# Patient Record
Sex: Female | Born: 1975 | Race: Black or African American | Hispanic: No | Marital: Single | Smoking: Current every day smoker
Health system: Southern US, Community
[De-identification: ages and names within clinical notes are randomized; demographics above are authoritative.]

## PROBLEM LIST (undated history)

## (undated) DIAGNOSIS — K5090 Crohn's disease, unspecified, without complications: Secondary | ICD-10-CM

## (undated) DIAGNOSIS — J45909 Unspecified asthma, uncomplicated: Secondary | ICD-10-CM

## (undated) DIAGNOSIS — G40909 Epilepsy, unspecified, not intractable, without status epilepticus: Secondary | ICD-10-CM

## (undated) DIAGNOSIS — M1990 Unspecified osteoarthritis, unspecified site: Secondary | ICD-10-CM

## (undated) DIAGNOSIS — F4310 Post-traumatic stress disorder, unspecified: Secondary | ICD-10-CM

## (undated) DIAGNOSIS — Z5189 Encounter for other specified aftercare: Secondary | ICD-10-CM

## (undated) DIAGNOSIS — C260 Malignant neoplasm of intestinal tract, part unspecified: Secondary | ICD-10-CM

## (undated) HISTORY — PX: COLON SURGERY: SHX602

## (undated) HISTORY — PX: SMALL INTESTINE SURGERY: SHX150

---

## 2007-11-05 ENCOUNTER — Emergency Department (HOSPITAL_BASED_OUTPATIENT_CLINIC_OR_DEPARTMENT_OTHER): Admission: EM | Admit: 2007-11-05 | Discharge: 2007-11-05 | Payer: Self-pay | Admitting: Emergency Medicine

## 2009-07-05 ENCOUNTER — Emergency Department (HOSPITAL_BASED_OUTPATIENT_CLINIC_OR_DEPARTMENT_OTHER): Admission: EM | Admit: 2009-07-05 | Discharge: 2009-07-05 | Payer: Self-pay | Admitting: Emergency Medicine

## 2009-07-05 ENCOUNTER — Ambulatory Visit: Payer: Self-pay | Admitting: Diagnostic Radiology

## 2010-06-14 LAB — BASIC METABOLIC PANEL
BUN: 9 mg/dL (ref 6–23)
Calcium: 8.7 mg/dL (ref 8.4–10.5)
GFR calc Af Amer: >60 mL/min (ref >60)
Glucose, Bld: 83 mg/dL (ref 70–99)

## 2010-06-14 LAB — DIFFERENTIAL
Eosinophils Relative: 2 % (ref 0–5)
Lymphocytes Relative: 39 % (ref 12–46)
Monocytes Absolute: 0.3 10*3/uL (ref 0.1–1.0)
Neutrophils Relative %: 52 % (ref 43–77)

## 2010-06-14 LAB — URINE MICROSCOPIC-ADD ON

## 2010-06-14 LAB — POCT TOXICOLOGY PANEL

## 2010-06-14 LAB — CBC
HCT: 26.7 % — ABNORMAL LOW (ref 36.0–46.0)
Hemoglobin: 8.4 g/dL — ABNORMAL LOW (ref 12.0–15.0)
MCHC: 31.6 g/dL (ref 30.0–36.0)
MCV: 67.6 fL — ABNORMAL LOW (ref 78.0–100.0)
RDW: 16.1 % — ABNORMAL HIGH (ref 11.5–15.5)
WBC: 4.9 10*3/uL (ref 4.0–10.5)

## 2010-06-14 LAB — URINALYSIS, ROUTINE W REFLEX MICROSCOPIC
Bilirubin Urine: NEGATIVE
Glucose, UA: NEGATIVE mg/dL
Hgb urine dipstick: NEGATIVE
Ketones, ur: NEGATIVE mg/dL
Protein, ur: NEGATIVE mg/dL
Urobilinogen, UA: 0.2 mg/dL (ref 0.0–1.0)

## 2010-06-14 LAB — MAGNESIUM: Magnesium: 1.8 mg/dL (ref 1.5–2.5)

## 2011-09-12 IMAGING — US US OB TRANSVAGINAL
1 series · 13 of 28 positions shown · non-contrast
Comparison: None.

CLINICAL DATA: Positive pregnancy test performed in the ER.
History of cervical cancer with three-quarters of the cervix
removed.  History of miscarriage on 05/31/2009 with no menses since
miscarriage.

OBSTETRIC <14 WK US AND TRANSVAGINAL OB US
TECHNIQUE: Both transabdominal and transvaginal ultrasound
examinations were performed for complete evaluation of the
gestation as well as the maternal uterus, adnexal regions, and
pelvic cul-de-sac.

[Series 1: us ob transvaginal · 0.26mm/px · 13 of 44 slices shown]
[im 2/44]
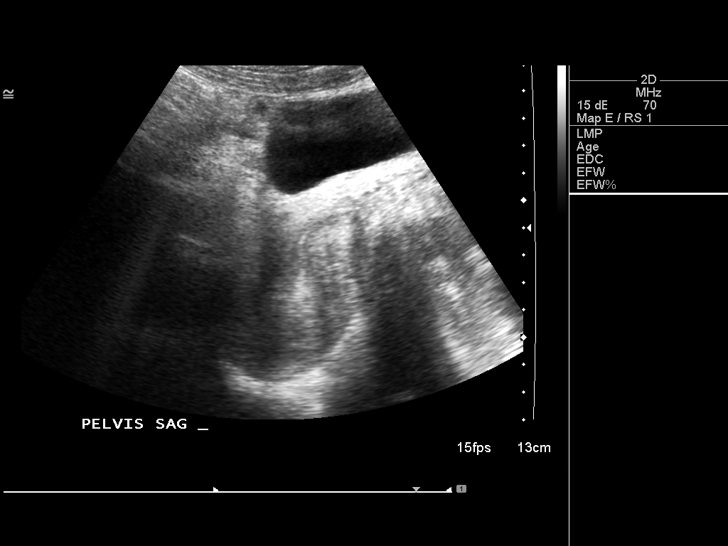
[im 5/44]
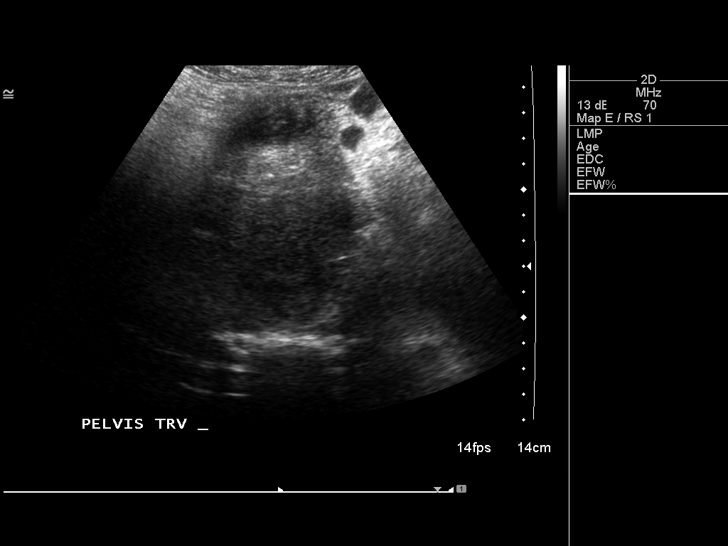
[im 8/44]
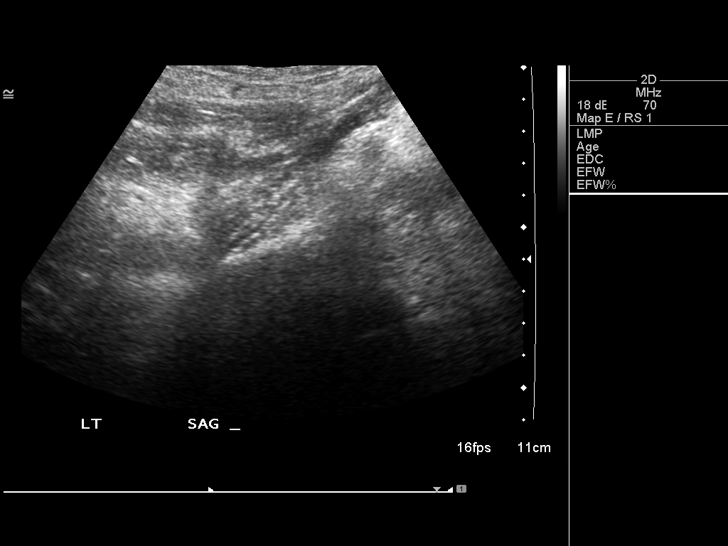
[im 12/44]
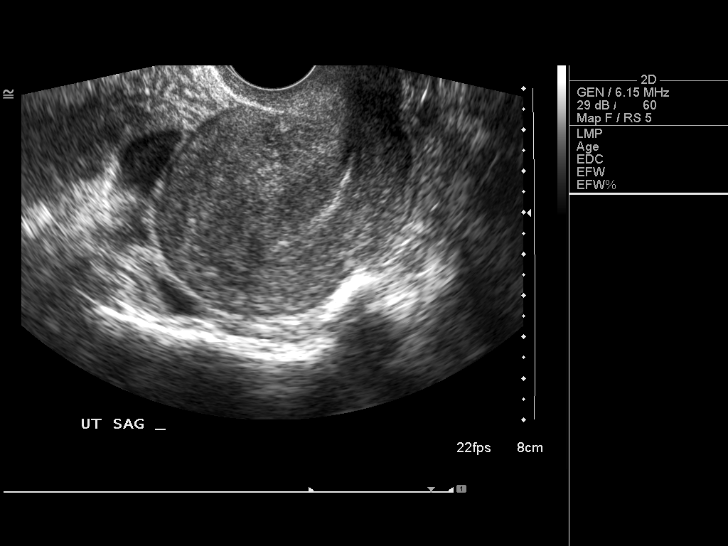
[im 15/44]
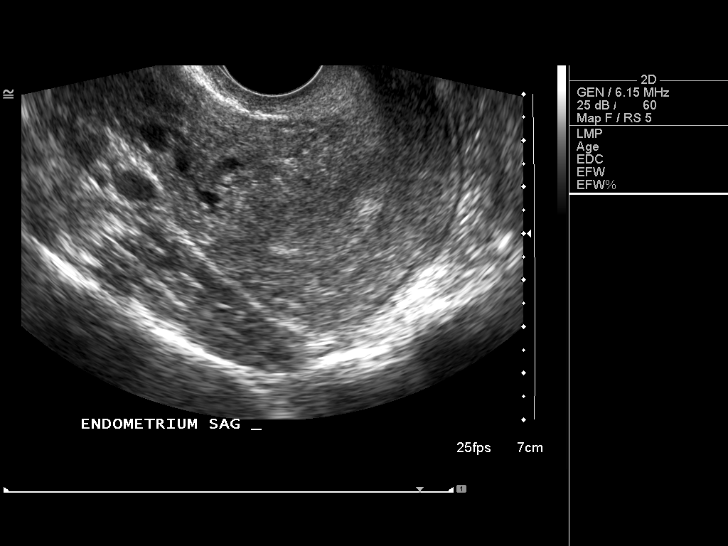
[im 18/44]
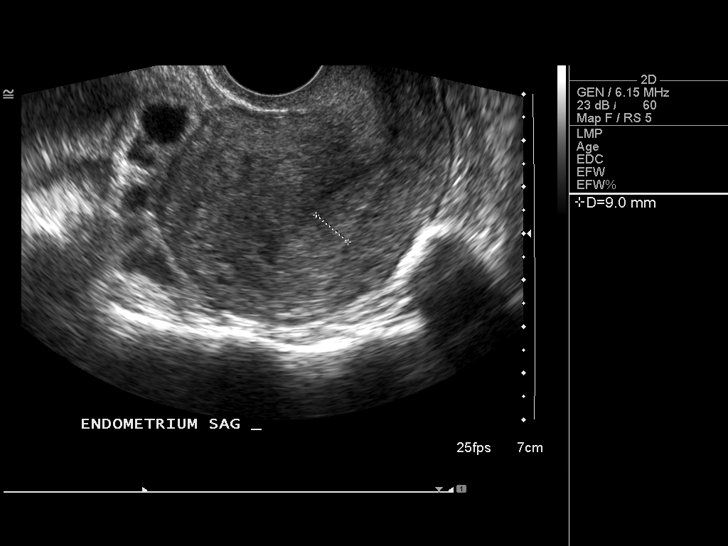
[im 23/44]
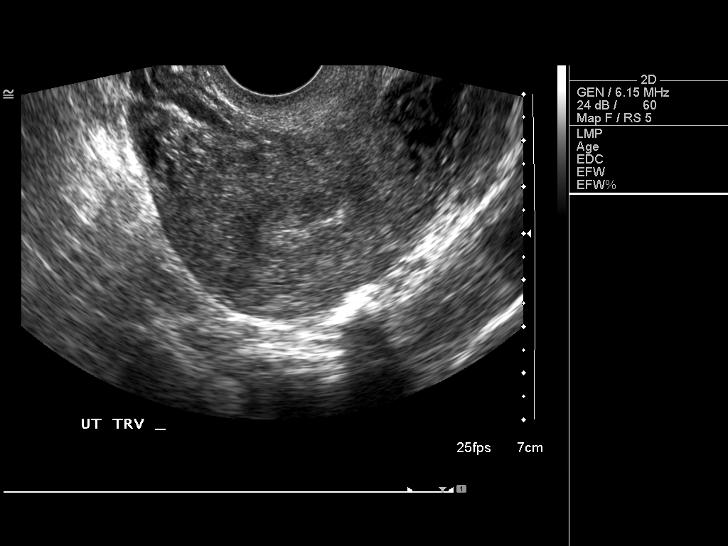
[im 26/44]
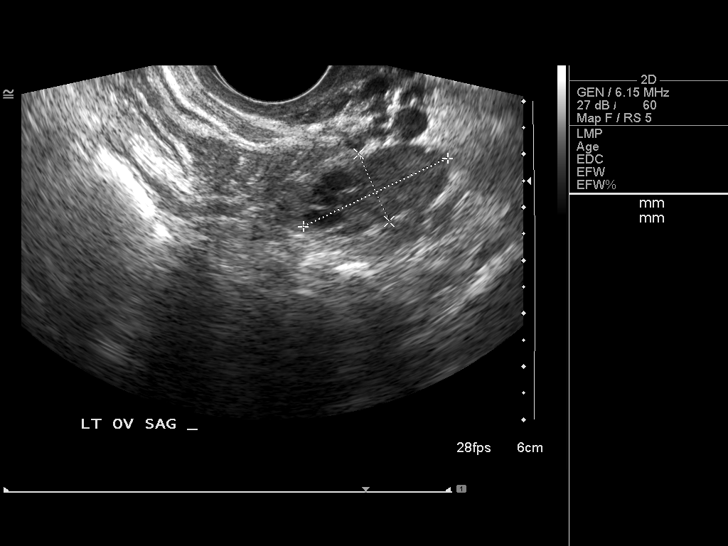
[im 29/44]
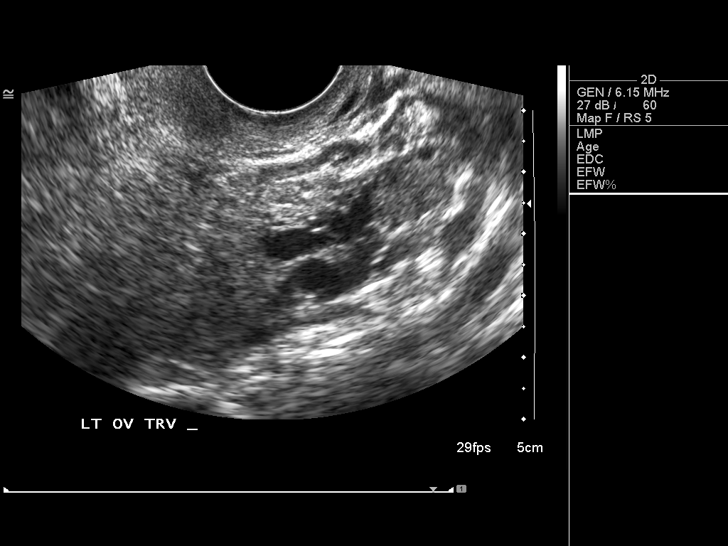
[im 32/44]
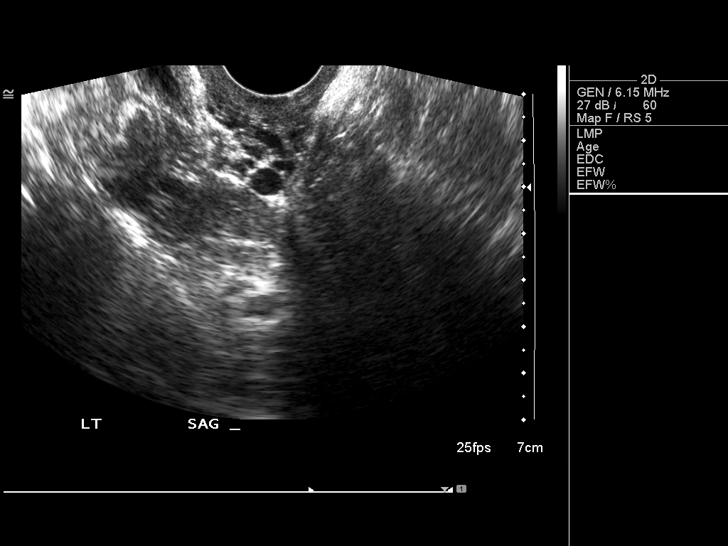
[im 36/44]
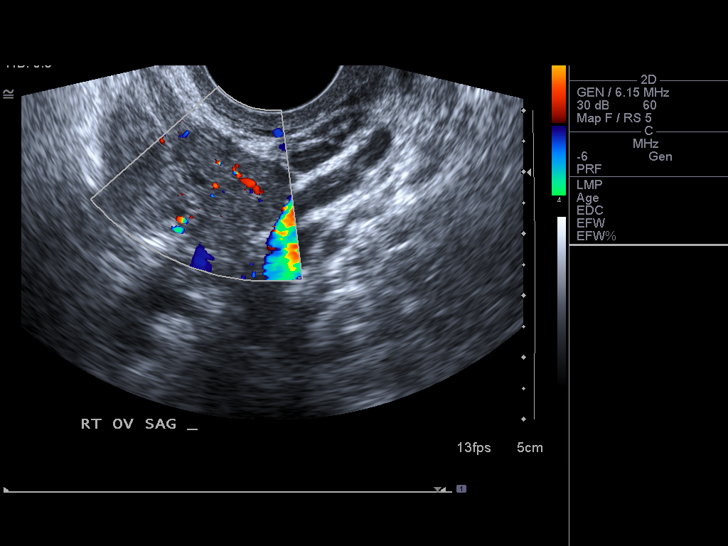
[im 39/44]
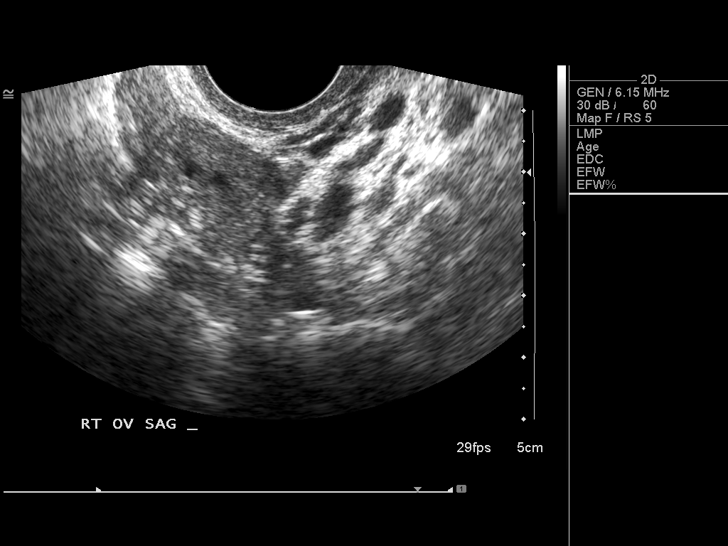
[im 42/44]
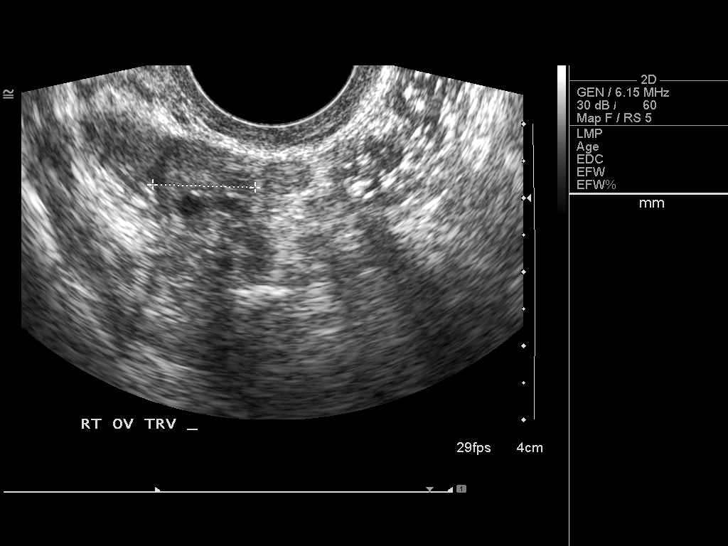

[13 of 28 positions shown; findings below may reference images not displayed]

Intrauterine gestational sac: Not seen
Yolk sac: Not seen
Embryo: Not seen
Cardiac Activity: Not applicable
Heart Rate:  bpm

MSD:   mm      w     d
CRL:               w     d          US EDC:

Maternal uterus/adnexae:
The uterus is retroverted and demonstrates a sagittal length of
cm, an AP width of 5.0 cm and a transverse width of 5.8 cm.  The
myometrium is homogeneous.  The endometrial lining is homogeneously
echogenic with an AP width of 10.8 mm.

Both ovaries have a normal appearance with the left ovary measuring
1.4 x 3.0 x 1.4 cm and the right ovary measuring 2.5 x 1.1 by
cm.  No separate adnexal masses or pelvic fluid are seen.
IMPRESSION: No evidence for an intrauterine gestation is seen.  The endometrial
lining is normal in width and appears homogeneous.  The ovaries are
normal with no separate adnexal masses or signs of intrapelvic
fluid seen.  Today's findings could correlate with an early
intrauterine pregnancy that is sonographically not distinguishable
because it is too early, a sonographically silent ectopic gestation
or an abnormal nonprogressing gestation.  Correlation with serial
quantitative beta HCG would be useful to help differentiate between
these possibilities with follow-up ultrasound as indicated.

We would expect to see evidence for an intrauterine gestational sac
with quantitative beta HCG of greater than 8644.

## 2014-09-05 ENCOUNTER — Encounter (HOSPITAL_BASED_OUTPATIENT_CLINIC_OR_DEPARTMENT_OTHER): Payer: Self-pay | Admitting: Emergency Medicine

## 2014-09-05 ENCOUNTER — Emergency Department (HOSPITAL_BASED_OUTPATIENT_CLINIC_OR_DEPARTMENT_OTHER): Payer: Medicare Other

## 2014-09-05 ENCOUNTER — Emergency Department (HOSPITAL_BASED_OUTPATIENT_CLINIC_OR_DEPARTMENT_OTHER)
Admission: EM | Admit: 2014-09-05 | Discharge: 2014-09-05 | Disposition: A | Discharge status: Home / Self Care | Payer: Medicare Other | Attending: Emergency Medicine | Admitting: Emergency Medicine

## 2014-09-05 DIAGNOSIS — Z720 Tobacco use: Secondary | ICD-10-CM | POA: Diagnosis not present

## 2014-09-05 DIAGNOSIS — Y998 Other external cause status: Secondary | ICD-10-CM | POA: Insufficient documentation

## 2014-09-05 DIAGNOSIS — Z8739 Personal history of other diseases of the musculoskeletal system and connective tissue: Secondary | ICD-10-CM | POA: Diagnosis not present

## 2014-09-05 DIAGNOSIS — Z85038 Personal history of other malignant neoplasm of large intestine: Secondary | ICD-10-CM | POA: Insufficient documentation

## 2014-09-05 DIAGNOSIS — Y9389 Activity, other specified: Secondary | ICD-10-CM | POA: Insufficient documentation

## 2014-09-05 DIAGNOSIS — Y9289 Other specified places as the place of occurrence of the external cause: Secondary | ICD-10-CM | POA: Insufficient documentation

## 2014-09-05 DIAGNOSIS — Z8719 Personal history of other diseases of the digestive system: Secondary | ICD-10-CM | POA: Insufficient documentation

## 2014-09-05 DIAGNOSIS — S59901A Unspecified injury of right elbow, initial encounter: Secondary | ICD-10-CM | POA: Diagnosis present

## 2014-09-05 DIAGNOSIS — Z3202 Encounter for pregnancy test, result negative: Secondary | ICD-10-CM | POA: Diagnosis not present

## 2014-09-05 DIAGNOSIS — S5001XA Contusion of right elbow, initial encounter: Secondary | ICD-10-CM | POA: Diagnosis not present

## 2014-09-05 DIAGNOSIS — W208XXA Other cause of strike by thrown, projected or falling object, initial encounter: Secondary | ICD-10-CM | POA: Diagnosis not present

## 2014-09-05 DIAGNOSIS — Z8659 Personal history of other mental and behavioral disorders: Secondary | ICD-10-CM | POA: Insufficient documentation

## 2014-09-05 DIAGNOSIS — Z8669 Personal history of other diseases of the nervous system and sense organs: Secondary | ICD-10-CM | POA: Insufficient documentation

## 2014-09-05 HISTORY — DX: Epilepsy, unspecified, not intractable, without status epilepticus: G40.909

## 2014-09-05 HISTORY — DX: Post-traumatic stress disorder, unspecified: F43.10

## 2014-09-05 HISTORY — DX: Crohn's disease, unspecified, without complications: K50.90

## 2014-09-05 HISTORY — DX: Malignant neoplasm of intestinal tract, part unspecified: C26.0

## 2014-09-05 HISTORY — DX: Unspecified osteoarthritis, unspecified site: M19.90

## 2014-09-05 LAB — URINALYSIS, ROUTINE W REFLEX MICROSCOPIC
BILIRUBIN URINE: NEGATIVE
GLUCOSE, UA: NEGATIVE mg/dL
Ketones, ur: NEGATIVE mg/dL
LEUKOCYTES UA: NEGATIVE
NITRITE: NEGATIVE
PROTEIN: NEGATIVE mg/dL
Specific Gravity, Urine: 1.011 (ref 1.005–1.030)
UROBILINOGEN UA: 1 mg/dL (ref 0.0–1.0)
pH: 6 (ref 5.0–8.0)

## 2014-09-05 LAB — URINE MICROSCOPIC-ADD ON

## 2014-09-05 LAB — PREGNANCY, URINE: Preg Test, Ur: NEGATIVE

## 2014-09-05 MED ORDER — HYDROCODONE-ACETAMINOPHEN 5-325 MG PO TABS: 2.0000 | ORAL_TABLET | ORAL | Status: AC | PRN

## 2014-09-05 NOTE — ED Provider Notes (Signed)
CSN: [id number]     Arrival date & time 09/05/14  1800 History  This chart was scribed for Leonard Schwartz, MD by Hansel Feinstein, ED Scribe. This patient was seen in room MH11/MH11 and the patient's care was started at 6:41 PM.     Chief Complaint  Patient presents with  . Arm Pain   The history is provided by the patient. No language interpreter was used.    HPI Comments: Patricia Turner is a 39 y.o. female who presents to the Emergency Department complaining of moderate, sudden onset, shooting right elbow pain that radiates to the hand onset earlier this evening when she was moving a bed and it fell on her. She states that pain is not relieved by anything and is worsened with movement. Per pt, she has had a similar injury in her other elbow. She denies other injuries or falls. She also denies numbness.    Past Medical History  Diagnosis Date  . Crohn's disease   . Intestinal cancer   . Epilepsy   . Arthritis   . PTSD (post-traumatic stress disorder)    Past Surgical History  Procedure Laterality Date  . Small intestine surgery     History reviewed. No pertinent family history. History  Substance Use Topics  . Smoking status: Current Some Day Smoker  . Smokeless tobacco: Never Used  . Alcohol Use: No   OB History    No data available     Review of Systems A complete 10 system review of systems was obtained and all systems are negative except as noted in the HPI and PMH.    Allergies  Ivp dye  Home Medications   Prior to Admission medications   Medication Sig Start Date End Date Taking? Authorizing Provider  HYDROcodone-acetaminophen (NORCO/VICODIN) 5-325 MG per tablet Take 2 tablets by mouth every 4 (four) hours as needed. 09/05/14   Leonard Schwartz, MD   BP 121/68 mmHg  Pulse 82  Temp(Src) 98.4 F (36.9 C) (Oral)  Resp 18  Ht 5\' 10"  (1.778 m)  Wt 137 lb (62.143 kg)  BMI 19.66 kg/m2  SpO2 96%  LMP 08/05/2014 Physical Exam  Constitutional: She is oriented to  person, place, and time. She appears well-developed and well-nourished. No distress.  HENT:  Head: Normocephalic and atraumatic.  Eyes: Pupils are equal, round, and reactive to light.  Neck: Normal range of motion.  Cardiovascular: Normal rate and intact distal pulses.   Pulmonary/Chest: No respiratory distress.  Abdominal: Normal appearance. She exhibits no distension.  Musculoskeletal: Normal range of motion. She exhibits tenderness.       Right elbow: She exhibits no swelling, no effusion and no deformity. Tenderness found.  Neurological: She is alert and oriented to person, place, and time. No cranial nerve deficit.  Skin: Skin is warm and dry. No rash noted.  Psychiatric: She has a normal mood and affect. Her behavior is normal.  Nursing note and vitals reviewed.   ED Course  Procedures (including critical care time) DIAGNOSTIC STUDIES: Oxygen Saturation is 96% on RA, adequate by my interpretation.    COORDINATION OF CARE: 6:43 PM Discussed treatment plan with pt at bedside and pt agreed to plan.   Labs Review Labs Reviewed  URINALYSIS, ROUTINE W REFLEX MICROSCOPIC (NOT AT [facility]) - Abnormal; Notable for the following:    APPearance CLOUDY (*)    Hgb urine dipstick TRACE (*)    All other components within normal limits  URINE MICROSCOPIC-ADD ON - Abnormal; Notable for the  following:    Squamous Epithelial / LPF MANY (*)    Bacteria, UA MANY (*)    All other components within normal limits  PREGNANCY, URINE    Imaging Review Dg Elbow Complete Right  09/05/2014   CLINICAL DATA:  Initial evaluation for acute trauma, fall.  EXAM: RIGHT ELBOW - COMPLETE 3+ VIEW  COMPARISON:  None.  FINDINGS: There is no evidence of fracture, dislocation, or joint effusion. There is no evidence of arthropathy or other focal bone abnormality. Soft tissues are unremarkable.  IMPRESSION: Negative.   Electronically Signed   By: Jeannine Boga M.D.   On: 09/05/2014 19:24      MDM   Final  diagnoses:  Elbow contusion, right, initial encounter    I personally performed the services described in this documentation, which was scribed in my presence. The recorded information has been reviewed and considered.     Leonard Schwartz, MD 09/05/14 [id number]

## 2014-09-05 NOTE — Discharge Instructions (Signed)
Contusion °A contusion is a deep bruise. Contusions happen when an injury causes bleeding under the skin. Signs of bruising include pain, puffiness (swelling), and discolored skin. The contusion may turn blue, purple, or yellow. °HOME CARE  °· Put ice on the injured area. °¨ Put ice in a plastic bag. °¨ Place a towel between your skin and the bag. °¨ Leave the ice on for 15-20 minutes, 03-04 times a day. °· Only take medicine as told by your doctor. °· Rest the injured area. °· If possible, raise (elevate) the injured area to lessen puffiness. °GET HELP RIGHT AWAY IF:  °· You have more bruising or puffiness. °· You have pain that is getting worse. °· Your puffiness or pain is not helped by medicine. °MAKE SURE YOU:  °· Understand these instructions. °· Will watch your condition. °· Will get help right away if you are not doing well or get worse. °Document Released: 08/29/2007 Document Revised: 06/04/2011 Document Reviewed: 01/15/2011 °[employer]® Patient Information ©2015 [employer]. This information is not intended to replace advice given to you by your health care provider. Make sure you discuss any questions you have with your health care provider. ° °

## 2014-09-05 NOTE — ED Notes (Signed)
Pt in c/o arm pain after moving a bed and the bed falling on her elbow. States electric type shooting pain down to hand.

## 2014-12-12 ENCOUNTER — Emergency Department (HOSPITAL_BASED_OUTPATIENT_CLINIC_OR_DEPARTMENT_OTHER)
Admission: EM | Admit: 2014-12-12 | Discharge: 2014-12-12 | Disposition: A | Discharge status: Home / Self Care | Payer: Medicare Other | Attending: Emergency Medicine | Admitting: Emergency Medicine

## 2014-12-12 DIAGNOSIS — Z8659 Personal history of other mental and behavioral disorders: Secondary | ICD-10-CM | POA: Insufficient documentation

## 2014-12-12 DIAGNOSIS — Z8739 Personal history of other diseases of the musculoskeletal system and connective tissue: Secondary | ICD-10-CM | POA: Insufficient documentation

## 2014-12-12 DIAGNOSIS — K088 Other specified disorders of teeth and supporting structures: Secondary | ICD-10-CM | POA: Insufficient documentation

## 2014-12-12 DIAGNOSIS — Z8669 Personal history of other diseases of the nervous system and sense organs: Secondary | ICD-10-CM | POA: Diagnosis not present

## 2014-12-12 DIAGNOSIS — K0889 Other specified disorders of teeth and supporting structures: Secondary | ICD-10-CM

## 2014-12-12 DIAGNOSIS — Z85038 Personal history of other malignant neoplasm of large intestine: Secondary | ICD-10-CM | POA: Diagnosis not present

## 2014-12-12 DIAGNOSIS — Z720 Tobacco use: Secondary | ICD-10-CM | POA: Diagnosis not present

## 2014-12-12 MED ORDER — IBUPROFEN 800 MG PO TABS
800.0000 mg | ORAL_TABLET | Freq: Once | ORAL | Status: AC
  Administered 2014-12-12: 800 mg via ORAL
  Filled 2014-12-12: qty 1

## 2014-12-12 MED ORDER — AZITHROMYCIN 250 MG PO TABS: 250.0000 mg | ORAL_TABLET | Freq: Every day | ORAL | Status: DC

## 2014-12-12 MED ORDER — IBUPROFEN 800 MG PO TABS: 800.0000 mg | ORAL_TABLET | Freq: Three times a day (TID) | ORAL | Status: AC

## 2014-12-12 MED ORDER — AZITHROMYCIN 250 MG PO TABS
500.0000 mg | ORAL_TABLET | Freq: Once | ORAL | Status: AC
  Administered 2014-12-12: 500 mg via ORAL
  Filled 2014-12-12: qty 2

## 2014-12-12 MED ORDER — PENICILLIN V POTASSIUM 500 MG PO TABS: 500.0000 mg | ORAL_TABLET | Freq: Four times a day (QID) | ORAL | Status: DC

## 2014-12-12 MED ORDER — BUPIVACAINE-EPINEPHRINE (PF) 0.5% -1:200000 IJ SOLN
1.8000 mL | Freq: Once | INTRAMUSCULAR | Status: DC
Start: 2014-12-12 — End: 2014-12-13
  Filled 2014-12-12: qty 1.8

## 2014-12-12 NOTE — ED Provider Notes (Signed)
CSN: [id number]     Arrival date & time 12/12/14  2001 History  This chart was scribed for Evelina Bucy, MD by Julien Nordmann, ED Scribe. This patient was seen in room MH10/MH10 and the patient's care was started at 8:18 PM.     Chief Complaint  Patient presents with  . Dental Pain      Patient is a 39 y.o. female presenting with tooth pain. The history is provided by the patient. No language interpreter was used.  Dental Pain Location:  Upper Upper teeth location:  11/LU cuspid Quality:  Constant Severity:  Moderate Onset quality:  Gradual Duration:  4 days Timing:  Constant Chronicity:  New Context: poor dentition   Relieved by:  Nothing Worsened by:  Nothing tried Ineffective treatments:  None tried Associated symptoms: facial pain and fever    HPI Comments: Patricia Turner is a 39 y.o. female who presents to the Emergency Department complaining of constant, gradual worsening left upper molar pain onset a few days ago. Pt reports having an associated fever yesterday and today of 101. She states taking tylenol to alleviate the pain with no relief. She states it made her break out in hives. Pt denies trouble swallowing and difficulty breathing.  Past Medical History  Diagnosis Date  . Crohn's disease   . Intestinal cancer   . Epilepsy   . Arthritis   . PTSD (post-traumatic stress disorder)    Past Surgical History  Procedure Laterality Date  . Small intestine surgery     No family history on file. Social History  Substance Use Topics  . Smoking status: Current Some Day Smoker  . Smokeless tobacco: Never Used  . Alcohol Use: No   OB History    No data available     Review of Systems  Constitutional: Positive for fever.  Respiratory: Negative for cough and shortness of breath.   Gastrointestinal: Negative for vomiting.  All other systems reviewed and are negative.     Allergies  Ivp dye  Home Medications   Prior to Admission medications    Medication Sig Start Date End Date Taking? Authorizing Provider  HYDROcodone-acetaminophen (NORCO/VICODIN) 5-325 MG per tablet Take 2 tablets by mouth every 4 (four) hours as needed. 09/05/14   Leonard Schwartz, MD   Triage vitals: BP 147/77 mmHg  Pulse 89  Temp(Src) 99 F (37.2 C)  Resp 19  SpO2 100%  LMP 11/07/2014 Physical Exam  Constitutional: She is oriented to person, place, and time. She appears well-developed and well-nourished. No distress.  HENT:  Head: Normocephalic and atraumatic.  Mouth/Throat: Oropharynx is clear and moist.  No intraoral abscess TTP of L maxillary canine.   Eyes: EOM are normal. Pupils are equal, round, and reactive to light.  Neck: Normal range of motion. Neck supple.  Cardiovascular: Normal rate and regular rhythm.  Exam reveals no friction rub.   No murmur heard. Pulmonary/Chest: Effort normal and breath sounds normal. No respiratory distress. She has no wheezes. She has no rales.  Abdominal: Soft. She exhibits no distension. There is no tenderness. There is no rebound.  Musculoskeletal: Normal range of motion. She exhibits no edema.  Neurological: She is alert and oriented to person, place, and time. No cranial nerve deficit. She exhibits normal muscle tone. Coordination normal.  Skin: No rash noted. She is not diaphoretic.  Nursing note and vitals reviewed.   ED Course  Procedures  DIAGNOSTIC STUDIES: Oxygen Saturation is 100% on RA, normal by my interpretation.  COORDINATION OF  CARE:  8:20 PM Discussed treatment plan which includes digital block with pt at bedside and pt agreed to plan.  Labs Review Labs Reviewed - No data to display  Imaging Review No results found. I have personally reviewed and evaluated these images and lab results as part of my medical decision-making.   EKG Interpretation None      MDM   Final diagnoses:  Pain, dental    39 year old female here with dental pain. Pain of the left maxillary canine. No  abscesses notified. No fevers or shortness of breath. Her mouth has multiple missing teeth. Offer dental block which she took. [place] well apparently. Given Motrin and given azithromycin for possible periapical abscess. She is allergic to penicillin. Stable for discharge. Given dental follow-up.  I personally performed the services described in this documentation, which was scribed in my presence. The recorded information has been reviewed and is accurate.     Evelina Bucy, MD 12/12/14 2125

## 2014-12-12 NOTE — ED Notes (Addendum)
C/o dental pain on L upper first molar. States she has had pain that has gradually worsened over the last few days. Reports fever of 101 that responded to tylenol. States she took Tylenol #3 at home and "broke out in hives" on her face. Reports taking 20 500 mg Tylenol since yesterday.

## 2014-12-12 NOTE — Discharge Instructions (Signed)
Dental Pain °A tooth ache may be caused by cavities (tooth decay). Cavities expose the nerve of the tooth to air and hot or cold temperatures. It may come from an infection or abscess (also called a boil or furuncle) around your tooth. It is also often caused by dental caries (tooth decay). This causes the pain you are having. °DIAGNOSIS  °Your caregiver can diagnose this problem by exam. °TREATMENT  °· If caused by an infection, it may be treated with medications which kill germs (antibiotics) and pain medications as prescribed by your caregiver. Take medications as directed. °· Only take over-the-counter or prescription medicines for pain, discomfort, or fever as directed by your caregiver. °· Whether the tooth ache today is caused by infection or dental disease, you should see your dentist as soon as possible for further care. °SEEK MEDICAL CARE IF: °The exam and treatment you received today has been provided on an emergency basis only. This is not a substitute for complete medical or dental care. If your problem worsens or new problems (symptoms) appear, and you are unable to meet with your dentist, call or return to this location. °SEEK IMMEDIATE MEDICAL CARE IF:  °· You have a fever. °· You develop redness and swelling of your face, jaw, or neck. °· You are unable to open your mouth. °· You have severe pain uncontrolled by pain medicine. °MAKE SURE YOU:  °· Understand these instructions. °· Will watch your condition. °· Will get help right away if you are not doing well or get worse. °Document Released: 03/12/2005 Document Revised: 06/04/2011 Document Reviewed: 10/29/2007 °ExitCare® Patient Information ©2015 [employer]. This information is not intended to replace advice given to you by your health care provider. Make sure you discuss any questions you have with your health care provider. ° °Emergency Department Resource Guide °1) Find a Doctor and Pay Out of Pocket °Although you won't have to find out who  is covered by your insurance plan, it is a good idea to ask around and get recommendations. You will then need to call the office and see if the doctor you have chosen will accept you as a new patient and what types of options they offer for patients who are self-pay. Some doctors offer discounts or will set up payment plans for their patients who do not have insurance, but you will need to ask so you aren't surprised when you get to your appointment. ° °2) Contact Your Local Health Department °Not all health departments have doctors that can see patients for sick visits, but many do, so it is worth a call to see if yours does. If you don't know where your local health department is, you can check in your phone book. The CDC also has a tool to help you locate your state's health department, and many state websites also have listings of all of their local health departments. ° °3) Find a Walk-in Clinic °If your illness is not likely to be very severe or complicated, you may want to try a walk in clinic. These are popping up all over the country in pharmacies, drugstores, and shopping centers. They're usually staffed by nurse practitioners or physician assistants that have been trained to treat common illnesses and complaints. They're usually fairly quick and inexpensive. However, if you have serious medical issues or chronic medical problems, these are probably not your best option. ° °No Primary Care Doctor: °- Call [facility] at  832-8000 - they can help you locate a primary   care doctor that  accepts your insurance, provides certain services, etc. °- Physician Referral Service- 1-800-533-3463 ° °Chronic Pain Problems: °Organization         Address  Phone   Notes  °[facility]  (336) 297-2271 Patients need to be referred by their primary care doctor.  ° °Medication Assistance: °Organization         Address  Phone   Notes  °[place] [facility] [address]  [address] °[place], [place] [address] (336) 641-8030 --Must be a resident of [place] °-- Must have NO insurance coverage whatsoever (no Medicaid/ Medicare, etc.) °-- The pt. MUST have a primary care doctor that directs their care regularly and follows them in the community °  °[facility]  (866) 331-1348   °[employer]  (888) 892-1162   ° °Agencies that provide inexpensive medical care: °Organization         Address  Phone   Notes  °[facility]  (336) 832-8035   °[facility]    (336) 832-7272   °[facility] [address] °[place], [place] [address] (336) 832-4777   °[facility] [address], °[place] (336) 271-4999   °[facility]    (336) 373-0678   °[facility]    (336) 272-1050   °[facility] ° [address], [place] Phone:  (336) 832-4444, Fax:  (336) 832-4440 Hours of Operation:  9 am - 6 pm, M-F.  Also accepts Medicaid/Medicare and self-pay.  °[facility] ° [address], [place] Phone: (336) 832-3150, Fax: (336) 832-3151. Hours of Operation:  8:30 am - 5:30 pm, M-F.  Also accepts Medicaid and self-pay.  °[facility] [place] [address], [place] Phone: (336) 878-6027   °[facility] [address], [place], [place] (336)723-1848, Ext. 123 Mondays & Thursdays: 7-9 AM.  First 15 patients are seen on a first come, first serve basis. °  ° °Medicaid-accepting [place] Providers: ° °Organization         Address  Phone   Notes  °[facility] [address], [place] (336) 641-2100 Also accepts self-pay patients.  °[facility] [address], [place] ° (336) 856-9996   °[facility] [address], [place] (336) 288-8857   °[facility] [address] [place] [address], [place] (336) 299-7000   °Veita Bland [address], [place]  ° (336) 373-1557 Only accepts  Access Medicaid patients after they have their name applied to their card.  ° °Self-Pay (no insurance) in [place]: ° °Organization         Address  Phone   Notes  °Sickle Cell Patients, [facility] [address], [place] (336) 832-1970   °[facility] [address], [place] (336) 832-4400   °[facility] ° [address], [place] (336) 992-4800   °[facility]/Dr. Osei-Bonsu ° [address] [place] [address], [place] or [address], [place] (336) 841-8500 Phone number for both [place] and [place] locations is the same.  °[facility] [address], [place] (336) 299-0000   °[facility]  [address] [place] [address],  or [address] (336) 852-7530 °(336) 878-2260   °  [facility] [address], [place] (336) 350-1642, phone; (336) 294-5005, fax Sees patients 1st and 3rd Saturday of every month.  Must not qualify for public or private insurance (i.e. Medicaid, Medicare, [place] Health Choice, Veterans' Benefits) • Household income should be no more than 200% of the poverty level •The clinic cannot treat you if you are pregnant or think you are pregnant • Sexually transmitted diseases are not treated at the clinic.  ° ° °Dental Care: °Organization         Address  Phone  Notes  °[facility] [facility] [address], [place] (336) 641-6152 Accepts children up to age 21 who are enrolled in Medicaid or [place] Health Choice; pregnant women with a Medicaid card; and children who have applied for Medicaid or [place] Health Choice, but were declined, whose parents can pay a reduced fee at time of service.  °[facility] [place]  [address], [place] (336) 641-7733 Accepts children up to age 21 who are  enrolled in Medicaid or [place] Health Choice; pregnant women with a Medicaid card; and children who have applied for Medicaid or [place] Health Choice, but were declined, whose parents can pay a reduced fee at time of service.  °[facility] ° [address], [place] (336) 641-4533 Patients are seen by appointment only. Walk-ins are not accepted. [facility] will see patients 18 years of age and older. °Monday - Tuesday (8am-5pm) °Most Wednesdays (8:30-5pm) °$30 per visit, cash only  °[facility] ° [address], [place] (336) 641-4533 Patients are seen by appointment only. Walk-ins are not accepted. [facility] will see patients 18 years of age and older. °One Wednesday Evening (Monthly: Volunteer Based).  $30 per visit, cash only  °[facility]  (919) 537-3737 for adults; Children under age 4, call Graduate Pediatric Dentistry at (919) [phone]. Children aged 4-14, please call (919) [phone] to request a pediatric application. ° Dental services are provided in all areas of dental care including fillings, crowns and bridges, complete and partial dentures, implants, gum treatment, root canals, and extractions. Preventive care is also provided. Treatment is provided to both adults and children. °Patients are selected via a lottery and there is often a waiting list. °  °[facility] [address], °[place] ° (336) 763-8833 [email or web address] °  °[facility] [address], [place], [place] (336)723-1848, Ext. 123 Second and Fourth Thursday of each month, opens at 6:30 AM; Clinic ends at 9 AM.  Patients are seen on a first-come first-served basis, and a limited number are seen during each clinic.  ° °[facility] ° [address], [place], [place] (336) 723-7904   Eligibility Requirements °You must have lived in [place], [place], or [place] counties for at least the last three months. °  You  cannot be eligible for state or federal sponsored healthcare insurance, including Veterans Administration, Medicaid, or Medicare. °  You generally cannot be eligible for healthcare insurance through your employer.  °  How to apply: °Eligibility screenings are held every Tuesday and Wednesday afternoon from 1:00 pm until 4:00 pm. You do not need an appointment for the interview!  °[facility] [address], [place], [place] 336-631-2330   °[facility] [facility]  336-342-8273   °[facility]  336-703-3100   °[facility]  336-570-6415   ° °Behavioral Health Resources in the Community: °Intensive Outpatient Programs °Organization         Address  Phone  Notes  °[facility] [address], [place], [place] 336-878-6098   °[facility] [address], [place], [place] 336-832-9800   °[facility] [address], [place], [place] ° 336-882-2125   °[facility] [address],  °[place], [place] 1-800-853-5163 or 336-641-4981   °Substance Abuse Resources °Organization         Address  Phone  Notes  °[facility]  336-882-2125   °[facility]  336-784-9470   °[facility]  336-285-9073   °[facility]  336-845-3988   °Residential & Outpatient Substance Abuse Program  1-800-659-3381   °Psychological Services °Organization         Address  Phone  Notes  °[facility]  336- 832-9600   °[facility]  336- 378-7881   °[facility] [address], [place] 1-800-853-5163 or 336-641-4981   ° °Mobile Crisis Teams °Organization         Address  Phone  Notes  °[facility], Mobile Crisis Care Unit  1-877-626-1772   °Assertive °[facility] ° [address] [place], [place] 336-834-9664   °Sharon DeEsch [address] °[place] 336-554-5454   ° °Self-Help/Support  Groups °Organization         Address  Phone             Notes  °[facility] [place] - variety of support groups  336- 373-1402 Call for more information  °Narcotics Anonymous (NA), [facility] [address], °[place]  2 meetings at this location  ° °Residential Treatment Programs °Organization         Address  Phone  Notes  °[facility] [address],    °[place]  1-866-801-8205   °[facility] ° [address], [place], [place] 704-293-8524   °[facility] [address], [place] 336-845-3988 Admissions: 8am-3pm M-F  °[facility] [address]    °[place], [place] 336-841-1104   °[facility] [address], [place], [place] 336-379-7146   °[facility] [address]  °[place], [place] 336-285-9073   °[facility] Intensive Outpatient [address], [place], [place] 336-852-3033   °[facility] [address]  °[place], [place] 1-877-615-2722 or 336-784-9470   °[facility] [address] [place], [place] 336-227-7417 Accepts Medicaid  °[facility] [address]  °[place] 1-800-659-3381 Substance Abuse/Addiction Treatment  ° °[place] Behavioral Health Resources °Organization         Address  Phone  Notes  °[facility]  (888) 581-9988   °Julie Brannon, PhD [address] [place], [place]   (336) 349-5553 or (336) 951-0000   °[facility]   [address] °[place], [place] (336) 349-4454   °[facility] [address], [place], [place] (336) 342-8316 Insurance/Medicaid/sponsorship through [facility]  °[facility] [address]                                    ,  (336) 342-8316 Therapy/tele-psych/case  °[facility]   [address]  ° [place], [place] (336) 349-2233    °Dr. Arfeen  (336) 349-4544   °[facility] [place]  [employer] [facility] [place] [facility] 1) [address], [place] °2) [address], [place] °3)  [address], [place] (336) 349-3220 °(336) 342-7768 ° °(336) 342-8140   °[place] Child Abuse [facility] (336) 342-1394 or (336) 342-3537 (After Hours)    ° ° ° °

## 2017-08-12 ENCOUNTER — Emergency Department (HOSPITAL_BASED_OUTPATIENT_CLINIC_OR_DEPARTMENT_OTHER)
Admission: EM | Admit: 2017-08-12 | Discharge: 2017-08-12 | Disposition: A | Discharge status: Home / Self Care | Payer: Medicare Other | Attending: Emergency Medicine | Admitting: Emergency Medicine

## 2017-08-12 ENCOUNTER — Encounter (HOSPITAL_BASED_OUTPATIENT_CLINIC_OR_DEPARTMENT_OTHER): Payer: Self-pay | Admitting: *Deleted

## 2017-08-12 ENCOUNTER — Other Ambulatory Visit: Payer: Self-pay

## 2017-08-12 DIAGNOSIS — Z5189 Encounter for other specified aftercare: Secondary | ICD-10-CM | POA: Diagnosis not present

## 2017-08-12 DIAGNOSIS — Y939 Activity, unspecified: Secondary | ICD-10-CM | POA: Insufficient documentation

## 2017-08-12 DIAGNOSIS — Z85038 Personal history of other malignant neoplasm of large intestine: Secondary | ICD-10-CM | POA: Diagnosis not present

## 2017-08-12 DIAGNOSIS — Y999 Unspecified external cause status: Secondary | ICD-10-CM | POA: Insufficient documentation

## 2017-08-12 DIAGNOSIS — H1131 Conjunctival hemorrhage, right eye: Secondary | ICD-10-CM | POA: Diagnosis not present

## 2017-08-12 DIAGNOSIS — Y929 Unspecified place or not applicable: Secondary | ICD-10-CM | POA: Diagnosis not present

## 2017-08-12 DIAGNOSIS — F430 Acute stress reaction: Secondary | ICD-10-CM | POA: Diagnosis not present

## 2017-08-12 DIAGNOSIS — S01511D Laceration without foreign body of lip, subsequent encounter: Secondary | ICD-10-CM | POA: Insufficient documentation

## 2017-08-12 DIAGNOSIS — F17200 Nicotine dependence, unspecified, uncomplicated: Secondary | ICD-10-CM | POA: Insufficient documentation

## 2017-08-12 DIAGNOSIS — Z79899 Other long term (current) drug therapy: Secondary | ICD-10-CM | POA: Diagnosis not present

## 2017-08-12 DIAGNOSIS — R110 Nausea: Secondary | ICD-10-CM | POA: Diagnosis not present

## 2017-08-12 DIAGNOSIS — Z760 Encounter for issue of repeat prescription: Secondary | ICD-10-CM | POA: Diagnosis not present

## 2017-08-12 HISTORY — DX: Encounter for other specified aftercare: Z51.89

## 2017-08-12 MED ORDER — PROMETHAZINE HCL 6.25 MG/5ML PO SYRP: 12.5000 mg | ORAL_SOLUTION | Freq: Four times a day (QID) | ORAL | 0 refills | Status: DC | PRN
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
## 2017-08-12 NOTE — ED Notes (Signed)
EDP into room, prior to RN assessment, see MD notes, orders received to d/c. Care assumed at time of d/c.
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
## 2017-09-19 ENCOUNTER — Other Ambulatory Visit: Payer: Self-pay

## 2017-09-19 ENCOUNTER — Emergency Department (HOSPITAL_BASED_OUTPATIENT_CLINIC_OR_DEPARTMENT_OTHER)
Admission: EM | Admit: 2017-09-19 | Discharge: 2017-09-19 | Disposition: A | Discharge status: Home / Self Care | Payer: Medicare Other | Attending: Emergency Medicine | Admitting: Emergency Medicine

## 2017-09-19 ENCOUNTER — Encounter (HOSPITAL_BASED_OUTPATIENT_CLINIC_OR_DEPARTMENT_OTHER): Payer: Self-pay | Admitting: Emergency Medicine

## 2017-09-19 DIAGNOSIS — Z79899 Other long term (current) drug therapy: Secondary | ICD-10-CM | POA: Diagnosis not present

## 2017-09-19 DIAGNOSIS — Z760 Encounter for issue of repeat prescription: Secondary | ICD-10-CM | POA: Diagnosis present

## 2017-09-19 DIAGNOSIS — F17200 Nicotine dependence, unspecified, uncomplicated: Secondary | ICD-10-CM | POA: Diagnosis not present

## 2017-09-19 DIAGNOSIS — Z85038 Personal history of other malignant neoplasm of large intestine: Secondary | ICD-10-CM | POA: Insufficient documentation

## 2017-09-19 MED ORDER — LEVETIRACETAM 1000 MG PO TABS
1000.0000 mg | ORAL_TABLET | Freq: Four times a day (QID) | ORAL | 0 refills | Status: DC
Start: 2017-09-19 — End: 2017-09-19

## 2017-09-19 MED ORDER — LEVETIRACETAM 1000 MG PO TABS: 1000.0000 mg | ORAL_TABLET | Freq: Four times a day (QID) | ORAL | 0 refills | Status: AC

## 2017-09-19 MED ORDER — PROMETHAZINE HCL 6.25 MG/5ML PO SYRP: 12.5000 mg | ORAL_SOLUTION | Freq: Four times a day (QID) | ORAL | 0 refills | Status: DC | PRN

## 2017-09-19 NOTE — ED Triage Notes (Addendum)
Patient states she needs refill of keppra and promethazine syrup.  Patient grinding teeth consistently during triage.  States she has not had keppra in several weeks and her doctor has retired.

## 2017-09-19 NOTE — ED Provider Notes (Signed)
[place] EMERGENCY DEPARTMENT Provider Note   CSN: [id number] Arrival date & time: 09/19/17  2426     History   Chief Complaint Chief Complaint  Patient presents with  . Medication Refill    HPI Patricia Turner is a 42 y.o. female.  HPI   42 year old female with history of seizures, Crohn's disease, PTSD presenting requesting for medication refill.  Patient mentioned she ran out off her promethazine, as well as her Keppra several days prior and unable to get it refilled.  She mentioned her gastroenterologist, Dr. Dorrene German, of whom she has been a patient with for the past 10 years is now retiring.  She does not like the other provider at this office and she is actively trying to find another gastroenterologist.  She did tried calling several place but unable to get an appointment until next month.  She is concerned that her symptoms will get worse without her normal medication.  She takes permethrin to help with her nausea and helps her eat.  She takes Keppra for seizure.  She denies any new symptoms.  She does not complain of any pain.  I have reviewed this patient's prior chart and noted that patient has had several missed appointments and often call to request refill of her promethazine over the phone.  It appears she is no longer a patient of that clinic due to no-shows.   Past Medical History:  Diagnosis Date  . Arthritis   . Blood transfusion without reported diagnosis   . Crohn's disease ([place])   . Epilepsy ([place])   . Intestinal cancer ([place])   . PTSD (post-traumatic stress disorder)     There are no active problems to display for this patient.   Past Surgical History:  Procedure Laterality Date  . COLON SURGERY    . SMALL INTESTINE SURGERY       OB History   None      Home Medications    Prior to Admission medications   Medication Sig Start Date End Date Taking? Authorizing Provider  albuterol (PROVENTIL) (5 MG/ML) 0.5% nebulizer solution Take  2.5 mg by nebulization every 6 (six) hours as needed for wheezing or shortness of breath.    [provider]  azithromycin (ZITHROMAX) 250 MG tablet Take 1 tablet (250 mg total) by mouth daily. Take first 2 tablets together, then 1 every day until finished. 12/12/14   Evelina Bucy, MD  carbamazepine (TEGRETOL) 200 MG tablet Take 200 mg by mouth 3 (three) times daily.    [provider]  HYDROcodone-acetaminophen (NORCO/VICODIN) 5-325 MG per tablet Take 2 tablets by mouth every 4 (four) hours as needed. 09/05/14   Leonard Schwartz, MD  ibuprofen (ADVIL,MOTRIN) 800 MG tablet Take 1 tablet (800 mg total) by mouth 3 (three) times daily. 12/12/14   Evelina Bucy, MD  levETIRAcetam (KEPPRA) 1000 MG tablet Take 1,000 mg by mouth 4 (four) times daily.    [provider]  mesalamine (PENTASA) 500 MG CR capsule Take 500 mg by mouth 4 (four) times daily.    [provider]  omeprazole (PRILOSEC) 20 MG capsule Take 20 mg by mouth daily.    [provider]  promethazine (PHENERGAN) 6.25 MG/5ML syrup Take 10 mLs (12.5 mg total) by mouth every 6 (six) hours as needed for nausea or vomiting. 08/12/17   Horton, Barbette Hair, MD    Family History History reviewed. No pertinent family history.  Social History Social History   Tobacco Use  . Smoking  status: Current Some Day Smoker  . Smokeless tobacco: Never Used  Substance Use Topics  . Alcohol use: Yes    Comment: occasioanl  . Drug use: No     Allergies   Codeine; Doxycycline; Flagyl [metronidazole]; Ivp dye [iodinated diagnostic agents]; Sulfa antibiotics; and Tramadol   Review of Systems Review of Systems  All other systems reviewed and are negative.    Physical Exam Updated Vital Signs BP 120/78   Pulse 85   Temp 98.3 F (36.8 C)   Resp 20   LMP 08/24/2017 (Approximate)   SpO2 95%   Physical Exam  Constitutional: She appears well-developed and well-nourished. No distress.  Stuttering speech    HENT:  Head: Atraumatic.  Eyes: Conjunctivae are normal.  Neck: Neck supple.  Cardiovascular: Normal rate and regular rhythm.  Pulmonary/Chest: Effort normal and breath sounds normal.  Abdominal: Soft. There is no tenderness.  Neurological: She is alert.  Skin: No rash noted.  Psychiatric: She has a normal mood and affect.  Nursing note and vitals reviewed.    ED Treatments / Results  Labs (all labs ordered are listed, but only abnormal results are displayed) Labs Reviewed - No data to display  EKG None  Radiology No results found.  Procedures Procedures (including critical care time)  Medications Ordered in ED Medications - No data to display   Initial Impression / Assessment and Plan / ED Course  I have reviewed the triage vital signs and the nursing notes.  Pertinent labs & imaging results that were available during my care of the patient were reviewed by me and considered in my medical decision making (see chart for details).     BP 120/78   Pulse 85   Temp 98.3 F (36.8 C)   Resp 20   LMP 08/24/2017 (Approximate)   SpO2 95%    Final Clinical Impressions(s) / ED Diagnoses   Final diagnoses:  Encounter for medication refill    ED Discharge Orders        Ordered    levETIRAcetam (KEPPRA) 1000 MG tablet  4 times daily     09/19/17 1855    promethazine (PHENERGAN) 6.25 MG/5ML syrup  Every 6 hours PRN     09/19/17 1855     6:52 PM Patient requesting refill of her Keppra and promethazine.  I have reviewed patient's prior chart and patient has history of noncompliance, and no shows to her follow-up appointments.  Her provider has contact with patient.  She is currently without a gastroenterologist.  She mentioned reaching out to several potential provider but have not had an appointment until next month.  I agreed to fill her Keppra.  I will also give her a small refill of her promethazine but patient made aware that we do not often provide review of her  medication on a consistent basis and she will need to establish outpatient care for her condition.  Resources provided.  Otherwise patient is stable for discharge.   Domenic Moras, PA-C 09/19/17 1856    Blanchie Dessert, MD 09/20/17 1324

## 2017-11-05 ENCOUNTER — Other Ambulatory Visit: Payer: Self-pay

## 2017-11-05 ENCOUNTER — Encounter (HOSPITAL_BASED_OUTPATIENT_CLINIC_OR_DEPARTMENT_OTHER): Payer: Self-pay | Admitting: Emergency Medicine

## 2017-11-05 ENCOUNTER — Emergency Department (HOSPITAL_BASED_OUTPATIENT_CLINIC_OR_DEPARTMENT_OTHER)
Admission: EM | Admit: 2017-11-05 | Discharge: 2017-11-05 | Disposition: A | Discharge status: Home / Self Care | Payer: Medicare Other | Attending: Emergency Medicine | Admitting: Emergency Medicine

## 2017-11-05 DIAGNOSIS — Z85068 Personal history of other malignant neoplasm of small intestine: Secondary | ICD-10-CM | POA: Diagnosis not present

## 2017-11-05 DIAGNOSIS — Z760 Encounter for issue of repeat prescription: Secondary | ICD-10-CM | POA: Insufficient documentation

## 2017-11-05 DIAGNOSIS — Z79899 Other long term (current) drug therapy: Secondary | ICD-10-CM | POA: Insufficient documentation

## 2017-11-05 DIAGNOSIS — F17200 Nicotine dependence, unspecified, uncomplicated: Secondary | ICD-10-CM | POA: Diagnosis not present

## 2017-11-05 DIAGNOSIS — K625 Hemorrhage of anus and rectum: Secondary | ICD-10-CM | POA: Diagnosis not present

## 2017-11-05 MED ORDER — PROMETHAZINE HCL 6.25 MG/5ML PO SYRP: 25.0000 mg | ORAL_SOLUTION | Freq: Four times a day (QID) | ORAL | 0 refills | Status: DC | PRN

## 2017-11-05 NOTE — ED Triage Notes (Addendum)
Pt states she is here for a refill of promethazine. States she is a cancer patient and is "in the process of finding a new doctor". Tachy in triage but no other obvious distress.

## 2017-11-05 NOTE — ED Provider Notes (Signed)
[facility] Provider Note: Georgena Spurling, MD, FACEP  CSN: [id number] MRN: [id number] ARRIVAL: 11/05/17 at [facility]: [place]  Medication Refill   HISTORY OF PRESENT ILLNESS  11/05/17 1:25 AM Patricia Turner is a 42 y.o. female reports a history of Crohn's disease and an unspecified type of intestinal cancer.  She is here requesting a refill for promethazine syrup for nausea.  She is also requesting referral to a gastroenterologist.  She reports her gastroenterologist retired but a review of physician notes indicates she was dismissed from his practice.  She states she is having rectal bleeding related to her Crohn's and believe she needs to have an endoscopy.  She has no other acute complaint.   Past Medical History:  Diagnosis Date  . Arthritis   . Blood transfusion without reported diagnosis   . Crohn's disease ([place])   . Epilepsy ([place])   . Intestinal cancer ([place])   . PTSD (post-traumatic stress disorder)     Past Surgical History:  Procedure Laterality Date  . COLON SURGERY    . SMALL INTESTINE SURGERY      No family history on file.  Social History   Tobacco Use  . Smoking status: Current Some Day Smoker  . Smokeless tobacco: Never Used  Substance Use Topics  . Alcohol use: Yes    Comment: occasioanl  . Drug use: No    Prior to Admission medications   Medication Sig Start Date End Date Taking? Authorizing Provider  ciprofloxacin (CIPRO) 500 MG tablet Take 500 mg by mouth 2 (two) times daily.   Yes [provider]  HYDROcodone-acetaminophen (NORCO/VICODIN) 5-325 MG per tablet Take 2 tablets by mouth every 4 (four) hours as needed. 09/05/14  Yes Leonard Schwartz, MD  albuterol (PROVENTIL) (5 MG/ML) 0.5% nebulizer solution Take 2.5 mg by nebulization every 6 (six) hours as needed for wheezing or shortness of breath.    [provider]  azithromycin (ZITHROMAX) 250 MG tablet Take 1 tablet (250 mg total) by mouth  daily. Take first 2 tablets together, then 1 every day until finished. 12/12/14   Evelina Bucy, MD  carbamazepine (TEGRETOL) 200 MG tablet Take 200 mg by mouth 3 (three) times daily.    [provider]  ibuprofen (ADVIL,MOTRIN) 800 MG tablet Take 1 tablet (800 mg total) by mouth 3 (three) times daily. 12/12/14   Evelina Bucy, MD  levETIRAcetam (KEPPRA) 1000 MG tablet Take 1 tablet (1,000 mg total) by mouth 4 (four) times daily. 09/19/17   Domenic Moras, PA-C  mesalamine (PENTASA) 500 MG CR capsule Take 500 mg by mouth 4 (four) times daily.    [provider]  omeprazole (PRILOSEC) 20 MG capsule Take 20 mg by mouth daily.    [provider]  promethazine (PHENERGAN) 6.25 MG/5ML syrup Take 10 mLs (12.5 mg total) by mouth every 6 (six) hours as needed for nausea or vomiting. 09/19/17   Domenic Moras, PA-C    Allergies Benadryl [diphenhydramine]; Cleocin [clindamycin hcl]; Codeine; Doxycycline; Flagyl [metronidazole]; Ivp dye [iodinated diagnostic agents]; Sulfa antibiotics; and Tramadol   REVIEW OF SYSTEMS  Negative except as noted here or in the History of Present Illness.   PHYSICAL EXAMINATION  Initial Vital Signs Blood pressure (!) 158/111, pulse (!) 116, resp. rate (!) 21, height 5\' 10"  (1.778 m), weight 59.9 kg, last menstrual period 10/14/2017, SpO2 98 %.  Examination General: Well-developed, well-nourished female in no acute distress; appearance consistent with age of record HENT: normocephalic; atraumatic  Eyes: Normal appearance Neck: supple Heart: regular rate and rhythm Lungs: clear to auscultation bilaterally Abdomen: soft; nondistended; nontender; bowel sounds present Extremities: No deformity; full range of motion Neurologic: Awake, alert and oriented; motor function intact in all extremities and symmetric; no facial droop Skin: Warm and dry Psychiatric: Normal mood and affect   RESULTS  Summary of this visit's results, reviewed by myself:   EKG  Interpretation  Date/Time:    Ventricular Rate:    PR Interval:    QRS Duration:   QT Interval:    QTC Calculation:   R Axis:     Text Interpretation:        Laboratory Studies: No results found for this or any previous visit (from the past 24 hour(s)). Imaging Studies: No results found.  ED COURSE and MDM  Nursing notes and initial vitals signs, including pulse oximetry, reviewed.  Vitals:   11/05/17 0122 11/05/17 0125  BP: (!) 158/111   Pulse: (!) 116 (!) 112  Resp: (!) 21   TempSrc: Oral   SpO2: 98%   Weight: 59.9 kg   Height: 5\' 10"  (1.778 m)    We will refill patient's promethazine as this is not a controlled substance.  We will refer to the gastroenterologist on-call.  PROCEDURES    ED DIAGNOSES     ICD-10-CM   1. Encounter for medication refill Z76.0        Jesscia Imm, Jenny Reichmann, MD 11/05/17 [phone]

## 2017-11-12 ENCOUNTER — Encounter (HOSPITAL_BASED_OUTPATIENT_CLINIC_OR_DEPARTMENT_OTHER): Payer: Self-pay

## 2017-11-12 ENCOUNTER — Emergency Department (HOSPITAL_BASED_OUTPATIENT_CLINIC_OR_DEPARTMENT_OTHER): Payer: Medicare Other

## 2017-11-12 ENCOUNTER — Emergency Department (HOSPITAL_BASED_OUTPATIENT_CLINIC_OR_DEPARTMENT_OTHER)
Admission: EM | Admit: 2017-11-12 | Discharge: 2017-11-12 | Disposition: A | Discharge status: Home / Self Care | Payer: Medicare Other | Attending: Emergency Medicine | Admitting: Emergency Medicine

## 2017-11-12 ENCOUNTER — Other Ambulatory Visit: Payer: Self-pay

## 2017-11-12 DIAGNOSIS — F17200 Nicotine dependence, unspecified, uncomplicated: Secondary | ICD-10-CM | POA: Insufficient documentation

## 2017-11-12 DIAGNOSIS — Z85068 Personal history of other malignant neoplasm of small intestine: Secondary | ICD-10-CM | POA: Insufficient documentation

## 2017-11-12 DIAGNOSIS — J45909 Unspecified asthma, uncomplicated: Secondary | ICD-10-CM | POA: Insufficient documentation

## 2017-11-12 DIAGNOSIS — R1032 Left lower quadrant pain: Secondary | ICD-10-CM | POA: Insufficient documentation

## 2017-11-12 DIAGNOSIS — R109 Unspecified abdominal pain: Secondary | ICD-10-CM | POA: Diagnosis present

## 2017-11-12 DIAGNOSIS — Z79899 Other long term (current) drug therapy: Secondary | ICD-10-CM | POA: Diagnosis not present

## 2017-11-12 HISTORY — DX: Unspecified asthma, uncomplicated: J45.909

## 2017-11-12 LAB — COMPREHENSIVE METABOLIC PANEL
ALK PHOS: 57 U/L (ref 38–126)
ALT: 11 U/L (ref 0–44)
AST: 14 U/L — AB (ref 15–41)
Albumin: 3.7 g/dL (ref 3.5–5.0)
Anion gap: 6 (ref 5–15)
BILIRUBIN TOTAL: 0.2 mg/dL — AB (ref 0.3–1.2)
BUN: 13 mg/dL (ref 6–20)
CO2: 25 mmol/L (ref 22–32)
CREATININE: 0.63 mg/dL (ref 0.44–1.00)
Calcium: 9 mg/dL (ref 8.9–10.3)
Chloride: 111 mmol/L (ref 98–111)
GFR calc Af Amer: >60 mL/min (ref >60)
GFR calc non Af Amer: >60 mL/min (ref >60)
GLUCOSE: 94 mg/dL (ref 70–99)
POTASSIUM: 3.7 mmol/L (ref 3.5–5.1)
Sodium: 142 mmol/L (ref 135–145)
TOTAL PROTEIN: 7.1 g/dL (ref 6.5–8.1)

## 2017-11-12 LAB — CBC
HEMATOCRIT: 31.8 % — AB (ref 36.0–46.0)
Hemoglobin: 10.6 g/dL — ABNORMAL LOW (ref 12.0–15.0)
MCH: 23.3 pg — ABNORMAL LOW (ref 26.0–34.0)
MCHC: 33.3 g/dL (ref 30.0–36.0)
MCV: 70 fL — ABNORMAL LOW (ref 78.0–100.0)
Platelets: 302 10*3/uL (ref 150–400)
RBC: 4.54 MIL/uL (ref 3.87–5.11)
RDW: 14.8 % (ref 11.5–15.5)
WBC: 5.9 10*3/uL (ref 4.0–10.5)

## 2017-11-12 LAB — URINALYSIS, ROUTINE W REFLEX MICROSCOPIC
BILIRUBIN URINE: NEGATIVE
GLUCOSE, UA: NEGATIVE mg/dL
KETONES UR: NEGATIVE mg/dL
Leukocytes, UA: NEGATIVE
Nitrite: NEGATIVE
PH: 6 (ref 5.0–8.0)
Protein, ur: NEGATIVE mg/dL
Specific Gravity, Urine: 1.01 (ref 1.005–1.030)

## 2017-11-12 LAB — URINALYSIS, MICROSCOPIC (REFLEX)

## 2017-11-12 LAB — RAPID URINE DRUG SCREEN, HOSP PERFORMED
Amphetamines: NOT DETECTED
BARBITURATES: NOT DETECTED
Benzodiazepines: NOT DETECTED
Cocaine: POSITIVE — AB
OPIATES: NOT DETECTED
TETRAHYDROCANNABINOL: NOT DETECTED

## 2017-11-12 LAB — PREGNANCY, URINE: Preg Test, Ur: NEGATIVE

## 2017-11-12 LAB — LIPASE, BLOOD: Lipase: 37 U/L (ref 11–51)

## 2017-11-12 MED ORDER — PROMETHAZINE HCL 6.25 MG/5ML PO SYRP: 25.0000 mg | ORAL_SOLUTION | Freq: Four times a day (QID) | ORAL | 0 refills | Status: DC | PRN

## 2017-11-12 MED ORDER — HEPARIN SOD (PORK) LOCK FLUSH 100 UNIT/ML IV SOLN
INTRAVENOUS | Status: AC
  Administered 2017-11-12: 500 [IU]
  Filled 2017-11-12: qty 5

## 2017-11-12 MED ORDER — PROMETHAZINE HCL 25 MG/ML IJ SOLN
12.5000 mg | Freq: Once | INTRAMUSCULAR | Status: AC
  Administered 2017-11-12: 12.5 mg via INTRAVENOUS
  Filled 2017-11-12: qty 1

## 2017-11-12 MED ORDER — ONDANSETRON HCL 4 MG/2ML IJ SOLN: 4.0000 mg | Freq: Once | INTRAMUSCULAR | Status: DC | PRN

## 2017-11-12 MED ORDER — SODIUM CHLORIDE 0.9 % IV BOLUS
1000.0000 mL | Freq: Once | INTRAVENOUS | Status: AC
  Administered 2017-11-12: 1000 mL via INTRAVENOUS

## 2017-11-12 NOTE — ED Notes (Signed)
Patient informed she was leaving AMA due to UA not being complete yet.  Patient stated she wasn't leaving AMA and that she was being discharged.  PA at bedside and informed patient that she was leaving AMA.  Patient angry and states she is not signing [place] paperwork.  States she will stay for UA results.

## 2017-11-12 NOTE — ED Provider Notes (Signed)
[facility] EMERGENCY DEPARTMENT Provider Note   CSN: [id number] Arrival date & time: 11/12/17  1511     History   Chief Complaint Chief Complaint  Patient presents with  . Abdominal Pain    HPI Patricia Turner is a 42 y.o. female.  HPI Patricia Turner is a 42 y.o. female with history of Crohn's disease, rectal ulcers with bleeding, PTSD, anemia, presents to emergency department complaining of abdominal pain, nausea, vomiting, rectal bleeding.  She states rectal bleeding is a chronic issue for her.  She states pain started yesterday with associated nausea and vomiting.  She states she normally takes Phenergan at home but she ran out.  She initially told me and the nurse that her gastroenterologist retired, however after review of her chart in care everywhere it appears that she was discharged from practice for missing appointments.  Patient has not had her Remicade or any of her medications for her Crohn's disease in over a month.  It looks like in the chart patient has been calling back to the office requesting Phenergan refill.  She finally was told that she was discharged from her practice 2 months ago.  She was also seen in emergency department 1 month ago and had a CT scan with no acute findings, labs were at baseline and she was discharged home.  Patient denies any fever or chills.  No urinary symptoms.  She has no other complaints.  Past Medical History:  Diagnosis Date  . Arthritis   . Asthma   . Blood transfusion without reported diagnosis   . Crohn's disease ([place])   . Epilepsy ([place])   . Intestinal cancer ([place])   . PTSD (post-traumatic stress disorder)     There are no active problems to display for this patient.   Past Surgical History:  Procedure Laterality Date  . COLON SURGERY    . SMALL INTESTINE SURGERY       OB History   None      Home Medications    Prior to Admission medications   Medication Sig Start Date End Date Taking?  Authorizing Provider  albuterol (PROVENTIL) (5 MG/ML) 0.5% nebulizer solution Take 2.5 mg by nebulization every 6 (six) hours as needed for wheezing or shortness of breath.    [provider]  carbamazepine (TEGRETOL) 200 MG tablet Take 200 mg by mouth 3 (three) times daily.    [provider]  HYDROcodone-acetaminophen (NORCO/VICODIN) 5-325 MG per tablet Take 2 tablets by mouth every 4 (four) hours as needed. 09/05/14   Leonard Schwartz, MD  ibuprofen (ADVIL,MOTRIN) 800 MG tablet Take 1 tablet (800 mg total) by mouth 3 (three) times daily. 12/12/14   Evelina Bucy, MD  levETIRAcetam (KEPPRA) 1000 MG tablet Take 1 tablet (1,000 mg total) by mouth 4 (four) times daily. 09/19/17   Domenic Moras, PA-C  mesalamine (PENTASA) 500 MG CR capsule Take 500 mg by mouth 4 (four) times daily.    [provider]  omeprazole (PRILOSEC) 20 MG capsule Take 20 mg by mouth daily.    [provider]  promethazine (PHENERGAN) 6.25 MG/5ML syrup Take 20 mLs (25 mg total) by mouth every 6 (six) hours as needed for nausea or vomiting. 11/05/17   Molpus, Jenny Reichmann, MD    Family History No family history on file.  Social History Social History   Tobacco Use  . Smoking status: Current Every Day Smoker    Packs/day: 0.50  . Smokeless tobacco: Never Used  Substance Use Topics  .  Alcohol use: Yes    Comment: occasioanl  . Drug use: No     Allergies   Hydrocodone; Ibuprofen; Iodine; Aspirin; Benadryl [diphenhydramine]; Cleocin [clindamycin hcl]; Codeine; Doxycycline; Flagyl [metronidazole]; Hydroxyzine hcl; Ivp dye [iodinated diagnostic agents]; Shellfish allergy; Sulfa antibiotics; Tramadol; and Acetaminophen   Review of Systems Review of Systems  Constitutional: Negative for chills and fever.  Respiratory: Negative for cough, chest tightness and shortness of breath.   Cardiovascular: Negative for chest pain, palpitations and leg swelling.  Gastrointestinal: Positive for abdominal pain,  blood in stool, diarrhea, nausea and vomiting.  Genitourinary: Negative for dysuria, flank pain, pelvic pain, vaginal bleeding, vaginal discharge and vaginal pain.  Musculoskeletal: Negative for arthralgias, myalgias, neck pain and neck stiffness.  Skin: Negative for rash.  Neurological: Negative for dizziness, weakness and headaches.  All other systems reviewed and are negative.    Physical Exam Updated Vital Signs BP (!) 146/94 (BP Location: Left Arm)   Pulse 67   Temp 97.7 F (36.5 C) (Oral)   Resp 18   Ht 5\' 10"  (1.778 m)   Wt 59 kg   LMP 10/14/2017   SpO2 100%   BMI 18.65 kg/m   Physical Exam  Constitutional: She appears well-developed and well-nourished. No distress.  HENT:  Head: Normocephalic.  Eyes: Conjunctivae are normal.  Neck: Neck supple.  Cardiovascular: Normal rate, regular rhythm and normal heart sounds.  Pulmonary/Chest: Effort normal and breath sounds normal. No respiratory distress. She has no wheezes. She has no rales.  Abdominal: Soft. Bowel sounds are normal. She exhibits no distension. There is tenderness in the left lower quadrant. There is no rebound.  Musculoskeletal: She exhibits no edema.  Neurological: She is alert.  Skin: Skin is warm and dry.  Psychiatric: She has a normal mood and affect. Her behavior is normal.  Nursing note and vitals reviewed.    ED Treatments / Results  Labs (all labs ordered are listed, but only abnormal results are displayed) Labs Reviewed  COMPREHENSIVE METABOLIC PANEL - Abnormal; Notable for the following components:      Result Value   AST 14 (*)    Total Bilirubin 0.2 (*)    All other components within normal limits  CBC - Abnormal; Notable for the following components:   Hemoglobin 10.6 (*)    HCT 31.8 (*)    MCV 70.0 (*)    MCH 23.3 (*)    All other components within normal limits  URINALYSIS, ROUTINE W REFLEX MICROSCOPIC - Abnormal; Notable for the following components:   Color, Urine STRAW (*)     Hgb urine dipstick TRACE (*)    All other components within normal limits  RAPID URINE DRUG SCREEN, HOSP PERFORMED - Abnormal; Notable for the following components:   Cocaine POSITIVE (*)    All other components within normal limits  URINALYSIS, MICROSCOPIC (REFLEX) - Abnormal; Notable for the following components:   Bacteria, UA RARE (*)    All other components within normal limits  LIPASE, BLOOD  PREGNANCY, URINE    EKG None  Radiology No results found.  Procedures Procedures (including critical care time)  Medications Ordered in ED Medications  ondansetron (ZOFRAN) injection 4 mg (has no administration in time range)  sodium chloride 0.9 % bolus 1,000 mL (has no administration in time range)  promethazine (PHENERGAN) injection 12.5 mg (has no administration in time range)     Initial Impression / Assessment and Plan / ED Course  I have reviewed the triage vital signs and  the nursing notes.  Pertinent labs & imaging results that were available during my care of the patient were reviewed by me and considered in my medical decision making (see chart for details).     Patient with history of Crohn's disease and rectal ulcers, here with increased abdominal pain since yesterday, blood in her stool that has been persistent for over a month, nausea, vomiting.  Her vital signs are normal.  She is in no acute distress.  Abdomen is soft, and mild tenderness in left lower quadrant.  Had a CT scan done at [facility] just a month ago with no acute findings.  Sounds like she was discharged from her gastroenterology practice due to multiple no-shows.  We will get labs, will treat with IV fluids and antiemetics.  Patient states she feels better after Phenergan.  Her labs are all unremarkable.  I reviewed patient's his, she had a CT scan just a month ago when she presented to [facility] with similar symptoms.  Based on my exam, her abdomen is soft, there is no tenderness, her  pain and tenderness is even distractible.  I do not think she has an acute abdomen.  I do not think she needs any advanced imaging at this time.  Patient has also had at least 30 CT scans of her abdomen and pelvis that I could count in our system.  I did discuss that she must follow-up with gastroenterology to get her Crohn's disease under control.  Her hemoglobin today is at baseline and I do not think she needs admission for transfusion even in setting of some rectal bleeding.  She requested prescription for Phenergan to take at home.  I will give her prescription.  We will have her follow-up with gastroenterology closely.  Vitals:   11/12/17 1514 11/12/17 1515 11/12/17 1733  BP: (!) 146/94  (!) 138/107  Pulse: 67  63  Resp: 18  18  Temp: 97.7 F (36.5 C)    TempSrc: Oral    SpO2: 100%  100%  Weight:  59 kg   Height:  5\' 10"  (1.778 m)      Final Clinical Impressions(s) / ED Diagnoses   Final diagnoses:  Left lower quadrant pain    ED Discharge Orders         Ordered    promethazine (PHENERGAN) 6.25 MG/5ML syrup  Every 6 hours PRN     11/12/17 1836           Jeannett Senior, PA-C 11/13/17 0010    Deno Etienne, DO 11/13/17 1945

## 2017-11-12 NOTE — ED Notes (Signed)
Pt refused to sign signature pad for AMA.

## 2017-11-12 NOTE — Discharge Instructions (Signed)
Take Phenergan as prescribed as needed for nausea and vomiting.  It is imperative that you follow-up with gastroenterology for further treatment of your Crohn's disease.

## 2017-11-12 NOTE — ED Notes (Signed)
Awaiting xray results before medication administration.

## 2017-11-12 NOTE — ED Triage Notes (Addendum)
Pt arrived via [facility]. EMS reports pt has a hx of Chron's and started having N/V and abd pain since last night. Pt also has a small mass to L axilla that is causing her pain. EMS admin 4mg  Zofran ODT PTA

## 2017-11-19 ENCOUNTER — Other Ambulatory Visit: Payer: Self-pay

## 2017-11-19 ENCOUNTER — Emergency Department (HOSPITAL_BASED_OUTPATIENT_CLINIC_OR_DEPARTMENT_OTHER)
Admission: EM | Admit: 2017-11-19 | Discharge: 2017-11-19 | Disposition: A | Discharge status: Home / Self Care | Payer: Medicare Other | Attending: Emergency Medicine | Admitting: Emergency Medicine

## 2017-11-19 ENCOUNTER — Encounter (HOSPITAL_BASED_OUTPATIENT_CLINIC_OR_DEPARTMENT_OTHER): Payer: Self-pay | Admitting: *Deleted

## 2017-11-19 DIAGNOSIS — R110 Nausea: Secondary | ICD-10-CM | POA: Insufficient documentation

## 2017-11-19 DIAGNOSIS — J45909 Unspecified asthma, uncomplicated: Secondary | ICD-10-CM | POA: Insufficient documentation

## 2017-11-19 DIAGNOSIS — Z760 Encounter for issue of repeat prescription: Secondary | ICD-10-CM | POA: Insufficient documentation

## 2017-11-19 DIAGNOSIS — Z85038 Personal history of other malignant neoplasm of large intestine: Secondary | ICD-10-CM | POA: Insufficient documentation

## 2017-11-19 DIAGNOSIS — F17210 Nicotine dependence, cigarettes, uncomplicated: Secondary | ICD-10-CM | POA: Insufficient documentation

## 2017-11-19 DIAGNOSIS — Z79899 Other long term (current) drug therapy: Secondary | ICD-10-CM | POA: Insufficient documentation

## 2017-11-19 MED ORDER — PROMETHAZINE HCL 6.25 MG/5ML PO SYRP: 12.5000 mg | ORAL_SOLUTION | Freq: Three times a day (TID) | ORAL | 0 refills | Status: AC | PRN

## 2017-11-19 NOTE — ED Provider Notes (Signed)
[facility] EMERGENCY DEPARTMENT Provider Note   CSN: [id number] Arrival date & time: 11/19/17  2042     History   Chief Complaint Chief Complaint  Patient presents with  . Medication Refill    HPI Patricia Turner is a 42 y.o. female.  The history is provided by the patient. No language interpreter was used.   Patricia Turner is a 42 y.o. female who presents to the Emergency Department complaining of medication refill. She presents to the emergency department requesting a refill on her Phenergan. She states that she takes Phenergan for nausea due to her current disease. She has been taking it four times daily with meals over the last several months. She currently does not have a gastroenterologist and is also requesting referral for TI. She states that she has no vomiting or abdominal pain or diarrhea/constipation if she takes the Phenergan. She does endorse smoking cigarettes and using occasional alcohol. She denies any street drug use. Records were reviewed an epic and she has been seen multiple times in the emergency department for Phenergan prescription and refill. She does not currently have a gastroenterologist and she does agree with this. She denies any Phenergan misuse or abuse and states that this is a routine daily medication for her.  Past Medical History:  Diagnosis Date  . Arthritis   . Asthma   . Blood transfusion without reported diagnosis   . Crohn's disease ([place])   . Epilepsy ([place])   . 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 PTSD (post-traumatic stress disorder)     There are no active problems to display for this patient.   Past Surgical History:  Procedure Laterality Date  . COLON SURGERY    . SMALL INTESTINE SURGERY       OB History   None      Home Medications    Prior to Admission medications   Medication Sig Start Date End Date Taking? Authorizing Provider  albuterol (PROVENTIL) (5 MG/ML) 0.5% nebulizer solution Take 2.5 mg by  nebulization every 6 (six) hours as needed for wheezing or shortness of breath.    [provider]  carbamazepine (TEGRETOL) 200 MG tablet Take 200 mg by mouth 3 (three) times daily.    [provider]  HYDROcodone-acetaminophen (NORCO/VICODIN) 5-325 MG per tablet Take 2 tablets by mouth every 4 (four) hours as needed. 09/05/14   Leonard Schwartz, MD  ibuprofen (ADVIL,MOTRIN) 800 MG tablet Take 1 tablet (800 mg total) by mouth 3 (three) times daily. 12/12/14   Evelina Bucy, MD  levETIRAcetam (KEPPRA) 1000 MG tablet Take 1 tablet (1,000 mg total) by mouth 4 (four) times daily. 09/19/17   Domenic Moras, PA-C  mesalamine (PENTASA) 500 MG CR capsule Take 500 mg by mouth 4 (four) times daily.    [provider]  omeprazole (PRILOSEC) 20 MG capsule Take 20 mg by mouth daily.    [provider]  promethazine (PHENERGAN) 6.25 MG/5ML syrup Take 10 mLs (12.5 mg total) by mouth every 8 (eight) hours as needed for nausea or vomiting. 11/19/17   Quintella Reichert, MD    Family History No family history on file.  Social History Social History   Tobacco Use  . Smoking status: Current Every Day Smoker    Packs/day: 0.50  . Smokeless tobacco: Never Used  Substance Use Topics  . Alcohol use: Yes    Comment: occasioanl  . Drug use: No     Allergies   Hydrocodone; Ibuprofen; Iodine; Aspirin; Benadryl [diphenhydramine]; Cleocin [  clindamycin hcl]; Codeine; Doxycycline; Flagyl [metronidazole]; Hydroxyzine hcl; Ivp dye [iodinated diagnostic agents]; Shellfish allergy; Sulfa antibiotics; Tramadol; and Acetaminophen   Review of Systems Review of Systems  All other systems reviewed and are negative.    Physical Exam Updated Vital Signs BP (!) 139/102 (BP Location: Right Arm)   Pulse (!) 112   Temp 98.5 F (36.9 C) (Oral)   Resp (!) 22   Ht 5\' 10"  (1.778 m)   Wt 61.7 kg   LMP 10/14/2017   SpO2 100%   BMI 19.51 kg/m   Physical Exam  Constitutional: She is oriented to  person, place, and time. She appears well-developed and well-nourished.  HENT:  Head: Normocephalic and atraumatic.  Cardiovascular: Regular rhythm.  No murmur heard. Tachycardic  Pulmonary/Chest: Effort normal and breath sounds normal. No respiratory distress.  Abdominal: Soft. There is no tenderness. There is no rebound and no guarding.  Musculoskeletal: She exhibits no edema or tenderness.  Neurological: She is alert and oriented to person, place, and time.  Skin: Skin is warm and dry.  Psychiatric: She has a normal mood and affect. Her behavior is normal.  Nursing note and vitals reviewed.    ED Treatments / Results  Labs (all labs ordered are listed, but only abnormal results are displayed) Labs Reviewed - No data to display  EKG None  Radiology No results found.  Procedures Procedures (including critical care time)  Medications Ordered in ED Medications - No data to display   Initial Impression / Assessment and Plan / ED Course  I have reviewed the triage vital signs and the nursing notes.  Pertinent labs & imaging results that were available during my care of the patient were reviewed by me and considered in my medical decision making (see chart for details).     Patient with history of Crohn's disease currently without a PCP or gastroenterologist here seeking refill on Phenergan. She is non-toxic appearing on examination with no abdominal tenderness. She denies any hematochezia. Discussed with patient concern for her multiple emergency department visits and that she may have Phenergan dependence. Discussed with patient side effects of Phenergan and problems that may be associated with over taking the medication. Discussed importance of tapering down her dose. Discussed alternative means to manage her nausea. She was given referrals for G.I. as well as PCP.   Final Clinical Impressions(s) / ED Diagnoses   Final diagnoses:  Encounter for medication refill  Nausea      ED Discharge Orders         Ordered    promethazine (PHENERGAN) 6.25 MG/5ML syrup  Every 8 hours PRN     11/19/17 2155           Quintella Reichert, MD 11/19/17 2352

## 2017-11-19 NOTE — ED Triage Notes (Signed)
She needs a refill for nausea medication. Phenergan syrup. She needs a new GI referral.

## 2017-12-04 ENCOUNTER — Encounter (HOSPITAL_BASED_OUTPATIENT_CLINIC_OR_DEPARTMENT_OTHER): Payer: Self-pay | Admitting: *Deleted

## 2017-12-04 ENCOUNTER — Other Ambulatory Visit: Payer: Self-pay

## 2017-12-04 ENCOUNTER — Emergency Department (HOSPITAL_BASED_OUTPATIENT_CLINIC_OR_DEPARTMENT_OTHER)
Admission: EM | Admit: 2017-12-04 | Discharge: 2017-12-04 | Disposition: A | Discharge status: Home / Self Care | Payer: Medicare Other | Attending: Emergency Medicine | Admitting: Emergency Medicine

## 2017-12-04 DIAGNOSIS — F17210 Nicotine dependence, cigarettes, uncomplicated: Secondary | ICD-10-CM | POA: Insufficient documentation

## 2017-12-04 DIAGNOSIS — Z79899 Other long term (current) drug therapy: Secondary | ICD-10-CM | POA: Diagnosis not present

## 2017-12-04 DIAGNOSIS — Z760 Encounter for issue of repeat prescription: Secondary | ICD-10-CM | POA: Diagnosis present

## 2017-12-04 DIAGNOSIS — J45909 Unspecified asthma, uncomplicated: Secondary | ICD-10-CM | POA: Diagnosis not present

## 2017-12-04 MED ORDER — ONDANSETRON HCL 4 MG PO TABS: 4.0000 mg | ORAL_TABLET | Freq: Three times a day (TID) | ORAL | 0 refills | Status: AC | PRN

## 2017-12-04 NOTE — ED Provider Notes (Signed)
[facility] EMERGENCY DEPARTMENT Provider Note   CSN: [id number] Arrival date & time: 12/04/17  1414     History   Chief Complaint Chief Complaint  Patient presents with  . Medication Refill    HPI Patricia Turner is a 42 y.o. female.  42 year old female presents with request for refill of Phenergan.  Patient states that she takes his medication for nausea due to her Crohn's disease.  She denies any complaints at this time.  Patient has been unable to follow-up with GI since discharge from her previous GI office 3 months ago.  Patient has contacted referral given at previous visits however was told they are not taking new patients at this time.  Patient has 5 visits to the emergency room in the past month for same request.  Last prescription given on August 29 for Phenergan DM.     Past Medical History:  Diagnosis Date  . Arthritis   . Asthma   . Blood transfusion without reported diagnosis   . Crohn's disease ([place])   . Epilepsy ([place])   . 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 PTSD (post-traumatic stress disorder)     There are no active problems to display for this patient.   Past Surgical History:  Procedure Laterality Date  . COLON SURGERY    . SMALL INTESTINE SURGERY       OB History   None      Home Medications    Prior to Admission medications   Medication Sig Start Date End Date Taking? Authorizing Provider  albuterol (PROVENTIL) (5 MG/ML) 0.5% nebulizer solution Take 2.5 mg by nebulization every 6 (six) hours as needed for wheezing or shortness of breath.    [provider]  carbamazepine (TEGRETOL) 200 MG tablet Take 200 mg by mouth 3 (three) times daily.    [provider]  HYDROcodone-acetaminophen (NORCO/VICODIN) 5-325 MG per tablet Take 2 tablets by mouth every 4 (four) hours as needed. 09/05/14   Leonard Schwartz, MD  ibuprofen (ADVIL,MOTRIN) 800 MG tablet Take 1 tablet (800 mg total) by mouth 3 (three) times daily. 12/12/14    Evelina Bucy, MD  levETIRAcetam (KEPPRA) 1000 MG tablet Take 1 tablet (1,000 mg total) by mouth 4 (four) times daily. 09/19/17   Domenic Moras, PA-C  mesalamine (PENTASA) 500 MG CR capsule Take 500 mg by mouth 4 (four) times daily.    [provider]  omeprazole (PRILOSEC) 20 MG capsule Take 20 mg by mouth daily.    [provider]  ondansetron (ZOFRAN) 4 MG tablet Take 1 tablet (4 mg total) by mouth every 8 (eight) hours as needed for nausea or vomiting. 12/04/17   Tacy Learn, PA-C  promethazine (PHENERGAN) 6.25 MG/5ML syrup Take 10 mLs (12.5 mg total) by mouth every 8 (eight) hours as needed for nausea or vomiting. 11/19/17   Quintella Reichert, MD    Family History History reviewed. No pertinent family history.  Social History Social History   Tobacco Use  . Smoking status: Current Every Day Smoker    Packs/day: 0.50  . Smokeless tobacco: Never Used  Substance Use Topics  . Alcohol use: Yes    Comment: occasioanl  . Drug use: No     Allergies   Hydrocodone; Ibuprofen; Iodine; Aspirin; Benadryl [diphenhydramine]; Cleocin [clindamycin hcl]; Codeine; Doxycycline; Flagyl [metronidazole]; Hydroxyzine hcl; Ivp dye [iodinated diagnostic agents]; Shellfish allergy; Sulfa antibiotics; Tramadol; and Acetaminophen   Review of Systems Review of Systems  Constitutional: Negative for fever.  Gastrointestinal: Positive  for nausea. Negative for abdominal pain, constipation, diarrhea and vomiting.  Skin: Negative for rash and wound.  Allergic/Immunologic: Positive for immunocompromised state.  All other systems reviewed and are negative.    Physical Exam Updated Vital Signs BP (!) 149/109   Pulse 98   Temp 98.3 F (36.8 C)   Resp 18   Wt 61 kg   SpO2 99%   BMI 19.30 kg/m   Physical Exam  Constitutional: She is oriented to person, place, and time. She appears well-developed and well-nourished. No distress.  HENT:  Head: Normocephalic and atraumatic.    Cardiovascular: Normal rate, regular rhythm and normal heart sounds.  Pulmonary/Chest: Effort normal and breath sounds normal. No respiratory distress.  Abdominal: Soft. She exhibits no distension. There is no tenderness.  Neurological: She is alert and oriented to person, place, and time.  Skin: Skin is warm and dry. She is not diaphoretic.  Psychiatric: She has a normal mood and affect. Her behavior is normal.  Nursing note and vitals reviewed.    ED Treatments / Results  Labs (all labs ordered are listed, but only abnormal results are displayed) Labs Reviewed - No data to display  EKG None  Radiology No results found.  Procedures Procedures (including critical care time)  Medications Ordered in ED Medications - No data to display   Initial Impression / Assessment and Plan / ED Course  I have reviewed the triage vital signs and the nursing notes.  Pertinent labs & imaging results that were available during my care of the patient were reviewed by me and considered in my medical decision making (see chart for details).  Clinical Course as of Dec 05 1450  Wed Dec 04, 4525  3378 42 year old female presents for refill of her Phenergan, states she takes this for nausea related to Crohn's.  Patient has been unable to get her prescription from her GI due to recent discharge from the practice and has not established care with a new GI.  Patient has a history of cocaine abuse, I have concern prescribing Phenergan for this patient.  Offered prescription for Zofran and referral to GI.   [LM]    Clinical Course User Index [LM] Tacy Learn, PA-C    Final Clinical Impressions(s) / ED Diagnoses   Final diagnoses:  Encounter for medication refill    ED Discharge Orders         Ordered    ondansetron (ZOFRAN) 4 MG tablet  Every 8 hours PRN     12/04/17 1448           Tacy Learn, PA-C 12/04/17 1452    Davonna Belling, MD 12/05/17 1454

## 2017-12-04 NOTE — Discharge Instructions (Addendum)
Unable to fill phenergan in the ER today.  Prescription for Zofran given. Follow up with Gi, referral given.

## 2017-12-04 NOTE — ED Triage Notes (Signed)
Pt requesting a script for phenergan liquid. Seen here several times for same.

## 2018-12-09 ENCOUNTER — Emergency Department (HOSPITAL_COMMUNITY)
Admission: EM | Admit: 2018-12-09 | Discharge: 2018-12-09 | Disposition: A | Discharge status: Home / Self Care | Payer: Medicare Other | Attending: Emergency Medicine | Admitting: Emergency Medicine

## 2018-12-09 ENCOUNTER — Emergency Department (HOSPITAL_COMMUNITY): Payer: Medicare Other

## 2018-12-09 ENCOUNTER — Encounter (HOSPITAL_COMMUNITY): Payer: Self-pay

## 2018-12-09 ENCOUNTER — Other Ambulatory Visit: Payer: Self-pay

## 2018-12-09 DIAGNOSIS — F17210 Nicotine dependence, cigarettes, uncomplicated: Secondary | ICD-10-CM | POA: Diagnosis not present

## 2018-12-09 DIAGNOSIS — Z8500 Personal history of malignant neoplasm of unspecified digestive organ: Secondary | ICD-10-CM | POA: Insufficient documentation

## 2018-12-09 DIAGNOSIS — Z8669 Personal history of other diseases of the nervous system and sense organs: Secondary | ICD-10-CM | POA: Diagnosis not present

## 2018-12-09 DIAGNOSIS — K920 Hematemesis: Secondary | ICD-10-CM | POA: Insufficient documentation

## 2018-12-09 DIAGNOSIS — K921 Melena: Secondary | ICD-10-CM | POA: Diagnosis present

## 2018-12-09 DIAGNOSIS — R109 Unspecified abdominal pain: Secondary | ICD-10-CM | POA: Insufficient documentation

## 2018-12-09 DIAGNOSIS — K6289 Other specified diseases of anus and rectum: Secondary | ICD-10-CM | POA: Insufficient documentation

## 2018-12-09 DIAGNOSIS — Z79899 Other long term (current) drug therapy: Secondary | ICD-10-CM | POA: Insufficient documentation

## 2018-12-09 LAB — URINALYSIS, ROUTINE W REFLEX MICROSCOPIC
Bilirubin Urine: NEGATIVE
Glucose, UA: NEGATIVE mg/dL
Hgb urine dipstick: NEGATIVE
Ketones, ur: NEGATIVE mg/dL
Nitrite: NEGATIVE
Protein, ur: NEGATIVE mg/dL
Specific Gravity, Urine: 1.009 (ref 1.005–1.030)
pH: 6 (ref 5.0–8.0)

## 2018-12-09 LAB — CBC WITH DIFFERENTIAL/PLATELET
Abs Immature Granulocytes: 0.01 10*3/uL (ref 0.00–0.07)
Basophils Absolute: 0 10*3/uL (ref 0.0–0.1)
Basophils Relative: 1 %
Eosinophils Absolute: 0.4 10*3/uL (ref 0.0–0.5)
Eosinophils Relative: 7 %
HCT: 31.8 % — ABNORMAL LOW (ref 36.0–46.0)
Hemoglobin: 10.1 g/dL — ABNORMAL LOW (ref 12.0–15.0)
Immature Granulocytes: 0 %
Lymphocytes Relative: 36 %
Lymphs Abs: 1.9 10*3/uL (ref 0.7–4.0)
MCH: 23.3 pg — ABNORMAL LOW (ref 26.0–34.0)
MCHC: 31.8 g/dL (ref 30.0–36.0)
MCV: 73.3 fL — ABNORMAL LOW (ref 80.0–100.0)
Monocytes Absolute: 0.4 10*3/uL (ref 0.1–1.0)
Monocytes Relative: 8 %
Neutro Abs: 2.5 10*3/uL (ref 1.7–7.7)
Neutrophils Relative %: 48 %
Platelets: 318 10*3/uL (ref 150–400)
RBC: 4.34 MIL/uL (ref 3.87–5.11)
RDW: 15.5 % (ref 11.5–15.5)
WBC: 5.3 10*3/uL (ref 4.0–10.5)
nRBC: 0 % (ref 0.0–0.2)

## 2018-12-09 LAB — COMPREHENSIVE METABOLIC PANEL
ALT: 10 U/L (ref 0–44)
AST: 12 U/L — ABNORMAL LOW (ref 15–41)
Albumin: 3.6 g/dL (ref 3.5–5.0)
Alkaline Phosphatase: 71 U/L (ref 38–126)
Anion gap: 11 (ref 5–15)
BUN: 10 mg/dL (ref 6–20)
CO2: 22 mmol/L (ref 22–32)
Calcium: 9.2 mg/dL (ref 8.9–10.3)
Chloride: 105 mmol/L (ref 98–111)
Creatinine, Ser: 1.04 mg/dL — ABNORMAL HIGH (ref 0.44–1.00)
GFR calc Af Amer: >60 mL/min (ref >60)
GFR calc non Af Amer: >60 mL/min (ref >60)
Glucose, Bld: 81 mg/dL (ref 70–99)
Potassium: 3.1 mmol/L — ABNORMAL LOW (ref 3.5–5.1)
Sodium: 138 mmol/L (ref 135–145)
Total Bilirubin: 0.4 mg/dL (ref 0.3–1.2)
Total Protein: 7.2 g/dL (ref 6.5–8.1)

## 2018-12-09 LAB — I-STAT BETA HCG BLOOD, ED (MC, WL, AP ONLY): I-stat hCG, quantitative: <5 m[IU]/mL (ref <5)

## 2018-12-09 LAB — POC OCCULT BLOOD, ED: Fecal Occult Bld: NEGATIVE

## 2018-12-09 LAB — LIPASE, BLOOD: Lipase: 20 U/L (ref 11–51)

## 2018-12-09 MED ORDER — PROMETHAZINE HCL 25 MG/ML IJ SOLN
25.0000 mg | Freq: Once | INTRAMUSCULAR | Status: AC
  Administered 2018-12-09: 25 mg via INTRAVENOUS
  Filled 2018-12-09: qty 1

## 2018-12-09 MED ORDER — HYDROMORPHONE HCL 1 MG/ML IJ SOLN
1.0000 mg | Freq: Once | INTRAMUSCULAR | Status: AC
  Administered 2018-12-09: 15:00:00 1 mg via INTRAVENOUS
  Filled 2018-12-09: qty 1

## 2018-12-09 MED ORDER — DICYCLOMINE HCL 10 MG PO CAPS
10.0000 mg | ORAL_CAPSULE | Freq: Once | ORAL | Status: AC
  Administered 2018-12-09: 20:00:00 10 mg via ORAL
  Filled 2018-12-09: qty 1

## 2018-12-09 MED ORDER — MORPHINE SULFATE (PF) 4 MG/ML IV SOLN
4.0000 mg | Freq: Once | INTRAVENOUS | Status: AC
  Administered 2018-12-09: 16:00:00 4 mg via INTRAVENOUS
  Filled 2018-12-09: qty 1

## 2018-12-09 MED ORDER — MORPHINE SULFATE (PF) 4 MG/ML IV SOLN
4.0000 mg | Freq: Once | INTRAVENOUS | Status: AC
  Administered 2018-12-09: 14:00:00 4 mg via INTRAVENOUS
  Filled 2018-12-09: qty 1

## 2018-12-09 MED ORDER — HEPARIN SOD (PORK) LOCK FLUSH 100 UNIT/ML IV SOLN
500.0000 [IU] | Freq: Once | INTRAVENOUS | Status: AC
  Administered 2018-12-09: 21:00:00 500 [IU]
  Filled 2018-12-09: qty 5

## 2018-12-09 MED ORDER — DICYCLOMINE HCL 20 MG PO TABS: 20.0000 mg | ORAL_TABLET | Freq: Two times a day (BID) | ORAL | 0 refills | Status: AC

## 2018-12-09 MED ORDER — ONDANSETRON 4 MG PO TBDP
4.0000 mg | ORAL_TABLET | Freq: Once | ORAL | Status: AC
  Administered 2018-12-09: 21:00:00 4 mg via ORAL
  Filled 2018-12-09: qty 1

## 2018-12-09 MED ORDER — SODIUM CHLORIDE 0.9 % IV BOLUS
2000.0000 mL | Freq: Once | INTRAVENOUS | Status: AC
  Administered 2018-12-09: 14:00:00 2000 mL via INTRAVENOUS

## 2018-12-09 MED ORDER — HYDROCORTISONE NA SUCCINATE PF 250 MG IJ SOLR
200.0000 mg | Freq: Once | INTRAMUSCULAR | Status: AC
Start: 2018-12-09 — End: 2018-12-09
  Administered 2018-12-09: 200 mg via INTRAVENOUS
  Filled 2018-12-09: qty 200

## 2018-12-09 NOTE — ED Provider Notes (Signed)
Physical Exam  BP 105/74   Pulse (!) 25   Temp 98.7 F (37.1 C) (Oral)   Resp 16   Wt 59 kg   SpO2 98%   BMI 18.65 kg/m   Physical Exam Vitals signs and nursing note reviewed. Exam conducted with a chaperone present.  Constitutional:      General: She is not in acute distress.    Appearance: She is well-developed. She is not diaphoretic.  HENT:     Head: Normocephalic and atraumatic.  Eyes:     General: No scleral icterus.    Conjunctiva/sclera: Conjunctivae normal.  Neck:     Musculoskeletal: Normal range of motion.  Pulmonary:     Effort: Pulmonary effort is normal. No respiratory distress.  Genitourinary:    Rectum: Guaiac result negative. No external hemorrhoid or internal hemorrhoid.  Skin:    Findings: No rash.  Neurological:     Mental Status: She is alert.     ED Course/Procedures     Procedures  MDM  Care handed off from previous provider PA lawyer.  Please see their note for further detail.  Briefly, patient is a 43 year old female with past medical history of Crohn's disease presenting to ED for ongoing abdominal pain, mucousy diarrhea and vomiting.  Patient symptoms will be treated here, will obtain lab work, CT of the abdomen pelvis without contrast due to her contrast allergy.  Consider admission if her symptoms do not improve or her CT scan is concerning for emergent process.  3:54 PM Patient continues to complain of pain in her rectal area.  States that she felt a mass/stricture in the area with palpation when she tried to do a self rectal exam.  Patient extremely distraught that nothing was done for her at her ED visit 3 days ago.  I performed a rectal exam with stool grossly Hemoccult negative but did notice some tightening of the anal tone on my exam but no external abnormalities.   8:27 PM  Ct Abdomen Pelvis Wo Contrast  Result Date: 12/09/2018 CLINICAL DATA:  Abdominal pain, history of Crohn's disease EXAM: CT ABDOMEN AND PELVIS WITHOUT  CONTRAST TECHNIQUE: Multidetector CT imaging of the abdomen and pelvis was performed following the standard protocol without IV contrast. COMPARISON:  CT October 25, 2018 FINDINGS: Imaging quality of the abdomen is degraded by respiratory motion artifact. Lower chest: Distal tip of the patient's previously seen Port-A-Cath terminates at the level of the right atrium. Lung bases are clear. Normal heart size. No pericardial effusion. Hepatobiliary: No focal liver abnormality is seen. No gallstones, gallbladder wall thickening, or biliary dilatation. Pancreas: Unremarkable. No pancreatic ductal dilatation or surrounding inflammatory changes. Spleen: Normal in size without focal abnormality. Adrenals/Urinary Tract: Adrenal glands are unremarkable. Kidneys are normal, without renal calculi, focal lesion, or hydronephrosis. Bladder is unremarkable. Stomach/Bowel: Contrast media traverses to the level of the splenic flexure. Distal esophagus, stomach and duodenal sweep are unremarkable. No bowel wall thickening or dilatation. No evidence of obstruction. A normal appendix is visualized. No colonic dilatation or mural thickening. Vascular/Lymphatic: Limited evaluation of the vasculature is unremarkable. No suspicious or enlarged lymph nodes in the included lymphatic chains. Reproductive: Retroverted uterus. No concerning adnexal lesions. Other: No abdominopelvic free fluid or free gas. No bowel containing hernias. Musculoskeletal: Dextrocurvature of the thoracolumbar spine. 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 No acute osseous abnormality. No suspicious osseous lesion. IMPRESSION: Imaging quality is slightly degraded by patient motion artifact. No acute intra-abdominal process. No convincing CT evidence of  active Crohn's disease. Electronically Signed   By: Lovena Le M.D.   On: 12/09/2018 19:46    CT scan without acute findings.  Patient continues to rest comfortably after medications given.  Patient states  she is nauseous but is able to hold down contrast and water.  Patient upset stating that "I cannot see my GI doctor because he does not even live near here."  Told patient that she will need to follow-up an appointment as they have a plan and further imaging set for her.  States that "I googled this I know something is going on."  Again told patient that due to her reassuring lab work, CT scan and improvement in symptoms there is no need for further evaluation at this time.  Of note, recent GI note stating that patient has frequent prescription request for liquid Phenergan so I do not think sending her home with more medications as appropriate.  Will discharge home with Bentyl.     Delia Heady, PA-C 12/09/18 2331    Quintella Reichert, MD 12/10/18 [phone]

## 2018-12-09 NOTE — ED Notes (Signed)
Pt has hiccup llike spasm to upper abd and torso, which relieve easy with medications and pt is able to rest.

## 2018-12-09 NOTE — ED Notes (Signed)
States spasm have calmed down but has continued nausea.   IVF completed.  Attempts at using bed pan.

## 2018-12-09 NOTE — ED Notes (Signed)
Pt has intermittent rest and then cramping.  Appreciative with cares.

## 2018-12-09 NOTE — ED Provider Notes (Signed)
[facility] EMERGENCY DEPARTMENT Provider Note   CSN: [id number] Arrival date & time: 12/09/18  1321     History   Chief Complaint No chief complaint on file.   HPI Patricia Turner is a 43 y.o. female.     HPI Patient presents to the emergency department with abdominal pain with bleeding rectally and vomiting blood.  She states that started this past Saturday states she was seen at [facility] and they discharged home.  She states she started vomiting worse last night.  She states she had mucousy discharge from her rectal area.  Patient states that there is been blood mixed with this.  She states she cannot take any medications other than her prescribed medications prior to arrival.  The patient denies chest pain, shortness of breath, headache,blurred vision, neck pain, fever, cough, weakness, numbness, dizziness, anorexia, edema,diarrhea, rash, back pain, dysuria, hematemesis, bloody stool, near syncope, or syncope. Past Medical History:  Diagnosis Date  . Arthritis   . Asthma   . Blood transfusion without reported diagnosis   . Crohn's disease ([place])   . Epilepsy ([place])   . 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 PTSD (post-traumatic stress disorder)     There are no active problems to display for this patient.   Past Surgical History:  Procedure Laterality Date  . COLON SURGERY    . SMALL INTESTINE SURGERY       OB History   No obstetric history on file.      Home Medications    Prior to Admission medications   Medication Sig Start Date End Date Taking? Authorizing Provider  albuterol (VENTOLIN HFA) 108 (90 Base) MCG/ACT inhaler Inhale 2 puffs into the lungs every 4 (four) hours as needed. 04/11/18  Yes [provider]  carbamazepine (TEGRETOL) 200 MG tablet Take 200 mg by mouth 3 (three) times daily.   Yes [provider]  levETIRAcetam (KEPPRA) 1000 MG tablet Take 1 tablet (1,000 mg total) by mouth 4 (four) times daily. 09/19/17   Yes Domenic Moras, PA-C  LORazepam (ATIVAN) 2 MG tablet Take 2 mg by mouth 2 (two) times daily.   Yes [provider]  pantoprazole (PROTONIX) 40 MG tablet Take 40 mg by mouth 2 (two) times daily. 11/04/18 01/03/19 Yes [provider]  promethazine (PHENERGAN) 6.25 MG/5ML syrup Take 10 mLs (12.5 mg total) by mouth every 8 (eight) hours as needed for nausea or vomiting. Patient taking differently: Take 12.5 mg by mouth 4 (four) times daily.  11/19/17  Yes Quintella Reichert, MD  sucralfate (CARAFATE) 1 g tablet Take 1 g by mouth 3 (three) times daily. 11/04/18  Yes [provider]  HYDROcodone-acetaminophen (NORCO/VICODIN) 5-325 MG per tablet Take 2 tablets by mouth every 4 (four) hours as needed. Patient not taking: Reported on 12/09/2018 09/05/14   Leonard Schwartz, MD  ibuprofen (ADVIL,MOTRIN) 800 MG tablet Take 1 tablet (800 mg total) by mouth 3 (three) times daily. Patient not taking: Reported on 12/09/2018 12/12/14   Evelina Bucy, MD  mesalamine (PENTASA) 500 MG CR capsule Take 500 mg by mouth 4 (four) times daily.    [provider]  omeprazole (PRILOSEC) 20 MG capsule Take 20 mg by mouth daily.    [provider]  ondansetron (ZOFRAN) 4 MG tablet Take 1 tablet (4 mg total) by mouth every 8 (eight) hours as needed for nausea or vomiting. 12/04/17   Tacy Learn, PA-C    Family History History reviewed. No pertinent family  history.  Social History Social History   Tobacco Use  . Smoking status: Current Every Day Smoker    Packs/day: 0.50  . Smokeless tobacco: Never Used  Substance Use Topics  . Alcohol use: Yes    Comment: occasioanl  . Drug use: No     Allergies   Hydrocodone, Ibuprofen, Iodine, Sulfasalazine, Aspirin, Cleocin [clindamycin hcl], Codeine, Doxycycline, Flagyl [metronidazole], Hydroxyzine hcl, Ivp dye [iodinated diagnostic agents], Other, Shellfish allergy, Sulfa antibiotics, Tramadol, Acetaminophen, Amoxicillin-pot clavulanate,  Cephalexin, Diphenhydramine, and Penicillins   Review of Systems Review of Systems  All other systems negative except as documented in the HPI. All pertinent positives and negatives as reviewed in the HPI. Physical Exam Updated Vital Signs BP (!) 119/93 (BP Location: Right Arm)   Pulse 95   Temp 98.7 F (37.1 C) (Oral)   Resp (!) 22   Wt 59 kg   SpO2 99%   BMI 18.65 kg/m   Physical Exam Vitals signs and nursing note reviewed.  Constitutional:      General: She is not in acute distress.    Appearance: She is well-developed.  HENT:     Head: Normocephalic and atraumatic.  Eyes:     Pupils: Pupils are equal, round, and reactive to light.  Neck:     Musculoskeletal: Normal range of motion and neck supple.  Cardiovascular:     Rate and Rhythm: Normal rate and regular rhythm.     Heart sounds: Normal heart sounds. No murmur. No friction rub. No gallop.   Pulmonary:     Effort: Pulmonary effort is normal. No respiratory distress.     Breath sounds: Normal breath sounds. No wheezing.  Abdominal:     General: Bowel sounds are normal. There is no distension.     Palpations: Abdomen is soft.     Tenderness: There is abdominal tenderness. There is guarding. There is no rebound.     Hernia: No hernia is present.  Skin:    General: Skin is warm and dry.     Capillary Refill: Capillary refill takes less than 2 seconds.     Findings: No erythema or rash.  Neurological:     Mental Status: She is alert and oriented to person, place, and time.     Motor: No abnormal muscle tone.     Coordination: Coordination normal.  Psychiatric:        Behavior: Behavior normal.      ED Treatments / Results  Labs (all labs ordered are listed, but only abnormal results are displayed) Labs Reviewed  CBC WITH DIFFERENTIAL/PLATELET - Abnormal; Notable for the following components:      Result Value   Hemoglobin 10.1 (*)    HCT 31.8 (*)    MCV 73.3 (*)    MCH 23.3 (*)    All other  components within normal limits  COMPREHENSIVE METABOLIC PANEL  LIPASE, BLOOD  URINALYSIS, ROUTINE W REFLEX MICROSCOPIC  I-STAT BETA HCG BLOOD, ED ([facility], [facility], [facility] ONLY)  POC OCCULT BLOOD, ED    EKG None  Radiology No results found.  Procedures Procedures (including critical care time)  Medications Ordered in ED Medications  morphine 4 MG/ML injection 4 mg (4 mg Intravenous Given 12/09/18 1353)  promethazine (PHENERGAN) injection 25 mg (25 mg Intravenous Given 12/09/18 1355)  sodium chloride 0.9 % bolus 2,000 mL (2,000 mLs Intravenous New Bag/Given 12/09/18 1350)  hydrocortisone sodium succinate (SOLU-CORTEF) injection 200 mg (200 mg Intravenous Given 12/09/18 1359)  HYDROmorphone (DILAUDID) injection 1 mg (1  mg Intravenous Given 12/09/18 1449)     Initial Impression / Assessment and Plan / ED Course  I have reviewed the triage vital signs and the nursing notes.  Pertinent labs & imaging results that were available during my care of the patient were reviewed by me and considered in my medical decision making (see chart for details).       Patient will await laboratory testing and CT scan.  Patient was signed out at shift change.  Patient has been stable thus far in the emergency department was given IV fluids and antiemetics along with pain control.   Final Clinical Impressions(s) / ED Diagnoses   Final diagnoses:  None    ED Discharge Orders    None       Dalia Heading, PA-C 12/09/18 1522    Drenda Freeze, MD 12/10/18 [phone]

## 2018-12-09 NOTE — ED Notes (Signed)
Pt requesting that her rectal exam be repeated because it was neg and she believes there is active GI bleeding and " it must be positive to get admitted".   Also requesting additional medications for nausea, although she has had no active vomiting and has kept the fluid contrast down at this time.

## 2018-12-09 NOTE — Discharge Instructions (Signed)
Your CT scan and lab work today were unremarkable. You will need to follow-up with your GI doctor or the GI doctor listed below for further evaluation as they are planning to do an MRI and scopes to further evaluate your symptoms. Return to the ED if you start to have worsening symptoms, develop a fever, vomiting not improved with your home medications, injuries or falls.

## 2018-12-09 NOTE — ED Notes (Signed)
Pt states her pain is at an 8, moves restlessly in bed again.  States she has been regurgitating without active emesis.  PA would like to wait for further medications till CT complete.  POC reviewed with pt.

## 2018-12-09 NOTE — Care Management (Signed)
Patient was diverted to [facility] ED from [facility]. She does not have a way home. Taxi voucher given to the patient's nurse.

## 2018-12-09 NOTE — ED Triage Notes (Signed)
DC'd from [facility] on Saturday with GI bleeding and states has not improved.   States she is passing mucus through her bowel now.  Pt does have some noted abd distention and pain noted.  Hx of chron's and colon cancer.  Vomiting since last pm, states leaking mucus only.  Pt has elevated mood and restlessness but is cooperative.

## 2021-02-15 IMAGING — CT CT ABD-PELV W/O CM
2 of 4 series · 16 of 46 positions shown, 18 images · non-contrast
Comparison: CT October 25, 2018

CLINICAL DATA: Abdominal pain, history of Crohn's disease

EXAM:
CT ABDOMEN AND PELVIS WITHOUT CONTRAST
TECHNIQUE: Multidetector CT imaging of the abdomen and pelvis was performed
following the standard protocol without IV contrast.

[Series 3: a/p w/o 5mm · axial · non-contrast · 0.93mm/px · z∈[+951,+1406]mm · 13 of 99 slices shown, 15 images]
[im 4/99  soft-tissue]
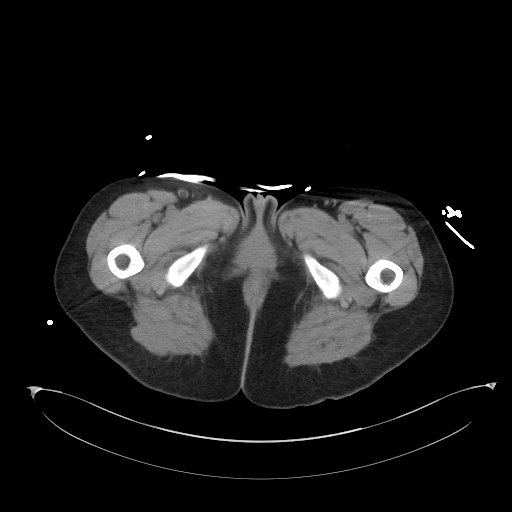
[im 4/99  bone]
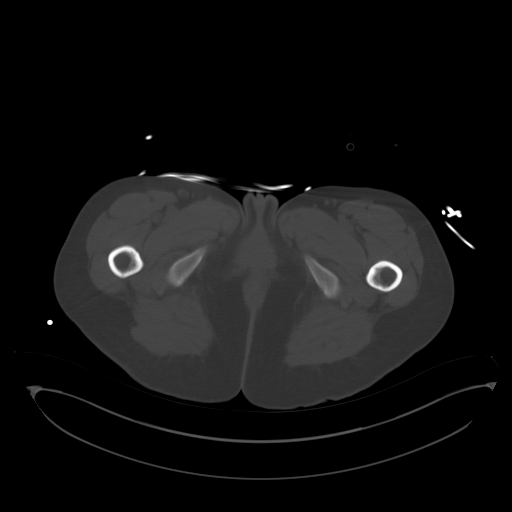
[im 12/99  soft-tissue]
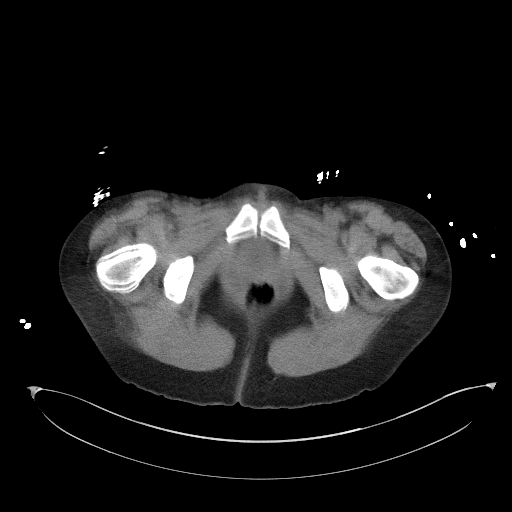
[im 20/99  soft-tissue]
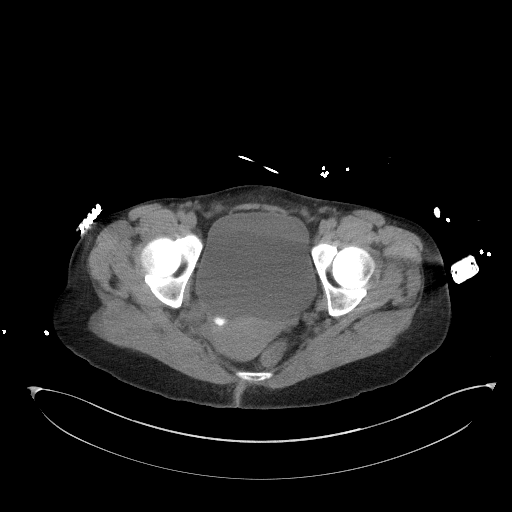
[im 28/99  soft-tissue]
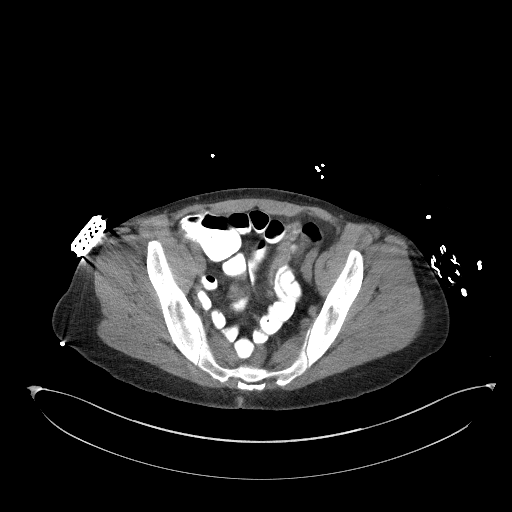
[im 36/99  soft-tissue]
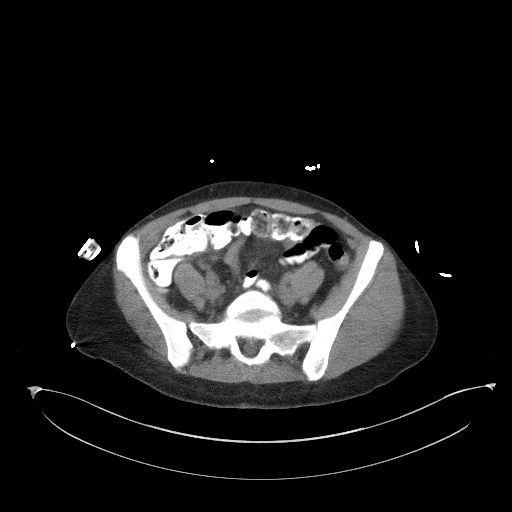
[im 44/99  soft-tissue]
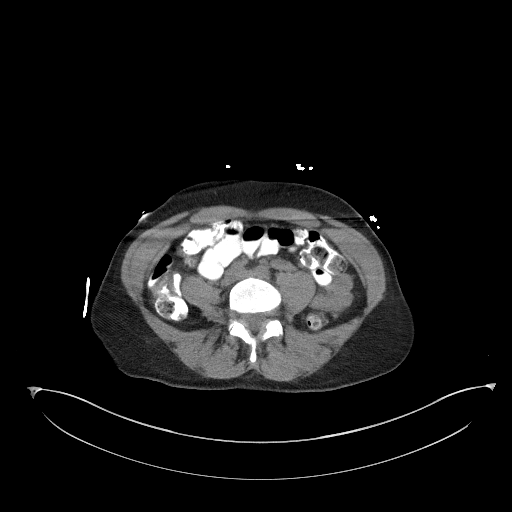
[im 51/99  soft-tissue]
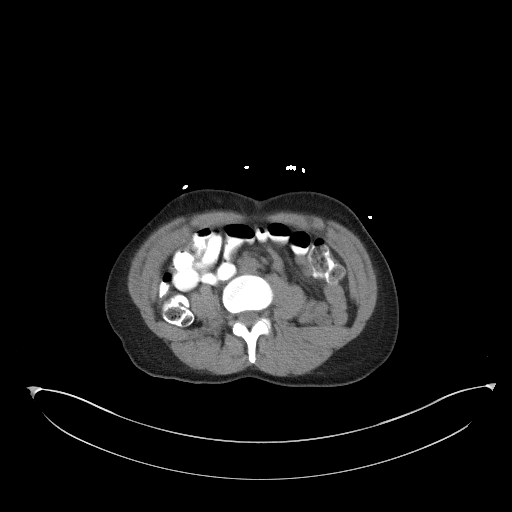
[im 55/99  soft-tissue]
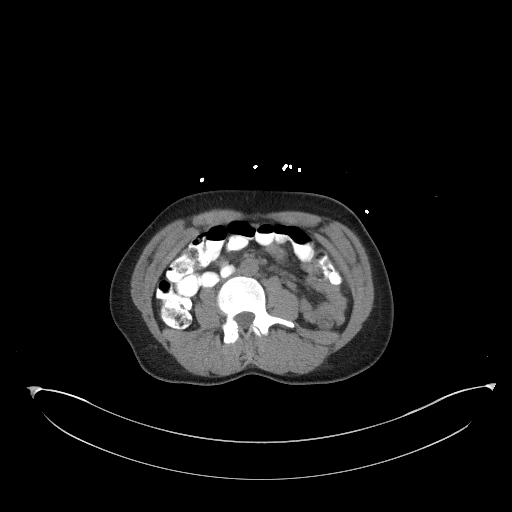
[im 63/99  soft-tissue]
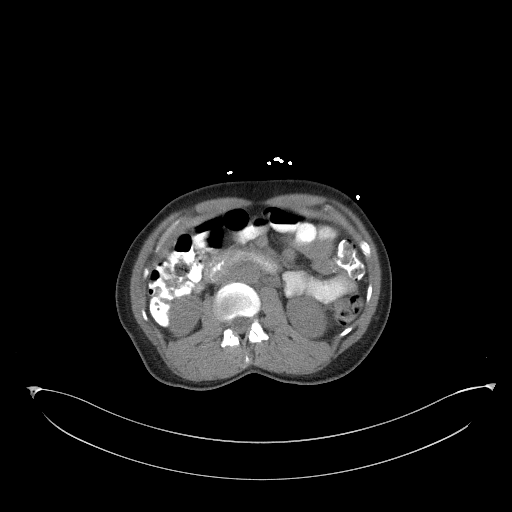
[im 63/99  bone]
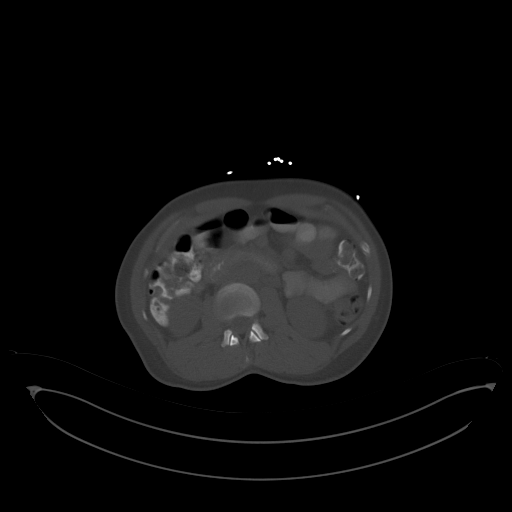
[im 71/99  soft-tissue]
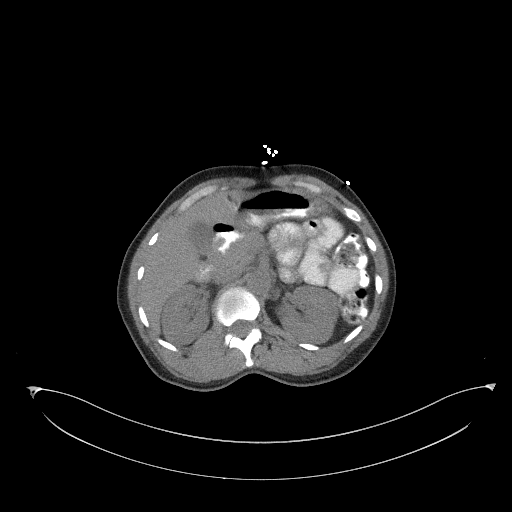
[im 79/99  soft-tissue]
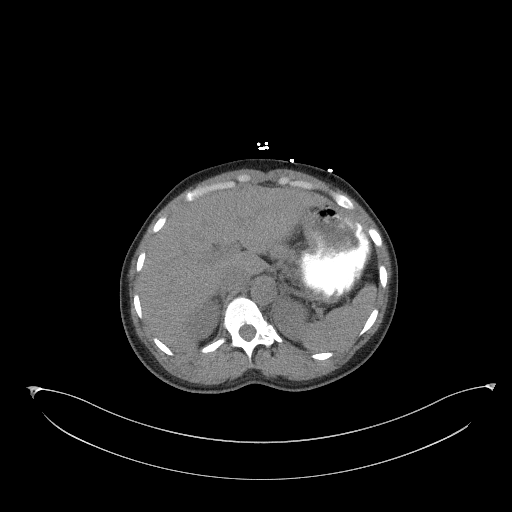
[im 87/99  soft-tissue]
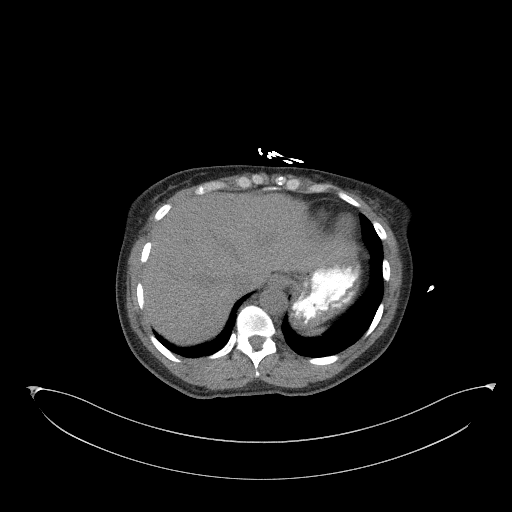
[im 95/99  soft-tissue]
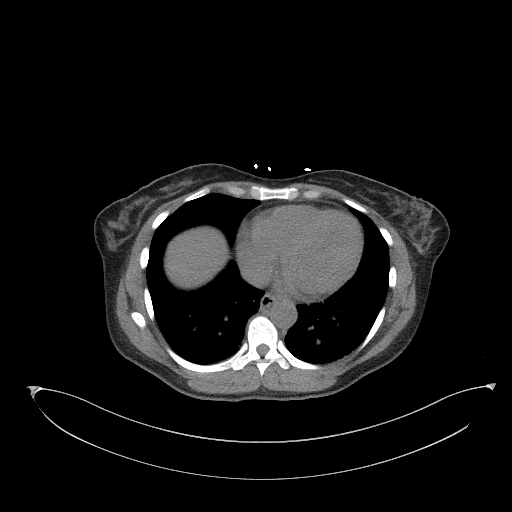

[Series 6: a/p w/o cor · coronal · non-contrast · 0.83mm/px · 3 of 135 slices shown]
[im 45/135  soft-tissue]
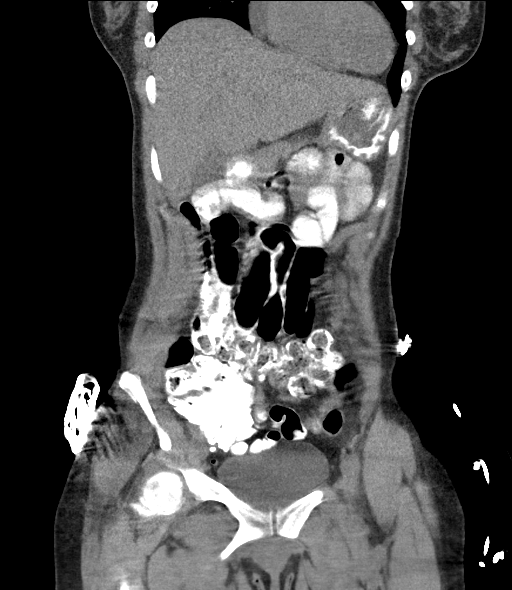
[im 60/135  soft-tissue]
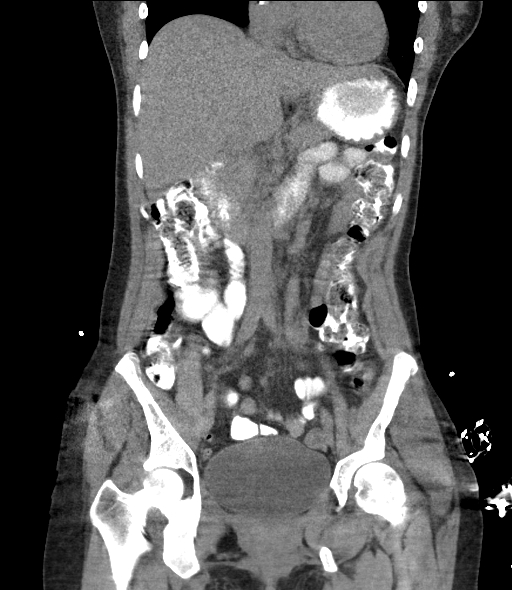
[im 75/135  soft-tissue]
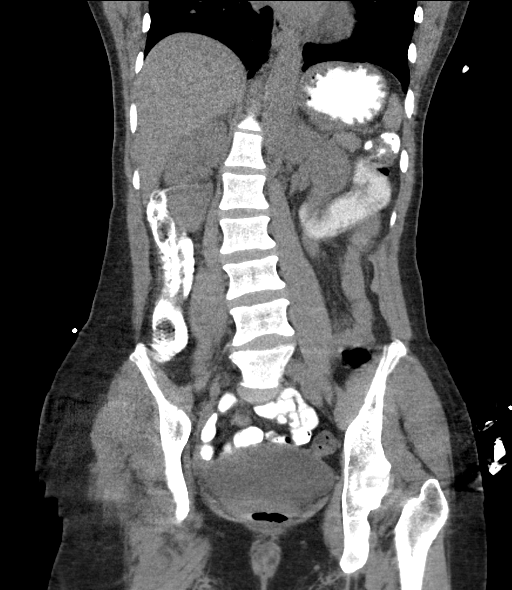

[16 of 46 positions shown; findings below may reference images not displayed]

FINDINGS: Imaging quality of the abdomen is degraded by respiratory motion
artifact.

Lower chest: Distal tip of the patient's previously seen Port-A-Cath
terminates at the level of the right atrium. Lung bases are clear.
Normal heart size. No pericardial effusion.

Hepatobiliary: No focal liver abnormality is seen. No gallstones,
gallbladder wall thickening, or biliary dilatation.

Pancreas: Unremarkable. No pancreatic ductal dilatation or
surrounding inflammatory changes.

Spleen: Normal in size without focal abnormality.

Adrenals/Urinary Tract: Adrenal glands are unremarkable. Kidneys are
normal, without renal calculi, focal lesion, or hydronephrosis.
Bladder is unremarkable.

Stomach/Bowel: Contrast media traverses to the level of the splenic
flexure. Distal esophagus, stomach and duodenal sweep are
unremarkable. No bowel wall thickening or dilatation. No evidence of
obstruction. A normal appendix is visualized. No colonic dilatation
or mural thickening.

Vascular/Lymphatic: Limited evaluation of the vasculature is
unremarkable. No suspicious or enlarged lymph nodes in the included
lymphatic chains.

Reproductive: Retroverted uterus. No concerning adnexal lesions.

Other: No abdominopelvic free fluid or free gas. No bowel containing
hernias.

Musculoskeletal: Dextrocurvature of the thoracolumbar spine.
Transitional anatomy with 6 lumbar type levels. No acute osseous
abnormality. No suspicious osseous lesion.
IMPRESSION: Imaging quality is slightly degraded by patient motion artifact.

No acute intra-abdominal process. No convincing CT evidence of
active Crohn's disease.

## 2023-12-11 LAB — AMB RESULTS CONSOLE CBG: Glucose: 126

## 2023-12-12 NOTE — Progress Notes (Signed)
 Client states she is having many issues around health including frequent seizures due to epilepsy. She said this is related to not having good blood sugar control. She said that her sugar was very a little while ago and that she had a slushy and was happy her blood sugar was 126. RN asked client if she had supplies, she states that her glucose management supplies were in storage and she didn't know when she could access them. She also states that she has been couch hopping with her boyfriend and that has prohibited her from being able to have access to her supplies. She states she needs support and RN able to connect to more resources at event to help her get care.

## 2024-01-28 NOTE — Progress Notes (Unsigned)
 The patient attended a screening event on 12/12/2023, where her blood pressure was measured at 110/66 mmHg, and her non-fasting blood glucose was 126 mg/dl, placing her in the diabetic range. During the event, the patient reported that she does/does not smoke, has insurance, isn't established with a PCP and indicated a housing SDOH concerns. No smoking status reported in the last 12 months but has a history reported fro May 24, 2022.  A chart review confirmed that the patient does not have an active PCP. She has Medicare and Medicaid for her health insurance. Chart review further indicates that she has a housing SDOH need.   Call Attempt #1: CHW called both numbers in chart to speak with pt about PCP status. Unable to reach pt at this time. CHW will attempt calls to pt again later.  Call Attempt #2: CHW called both numbers again to speak with pt. CHW was unable to reach pt again.  Call Attempt #3: CHW called for third attempt to pt. Both numbers were not reachable again to speak with pt.  CHW in basket CNP RN Aloysius Ford about the pt's PCP, blood glucose education, and housing SDOH need plus [place] [address] to further support her in case she returns to [facility] for further support.  An additional follow up will be done at a later date per the [employer] protocol.

## 2024-03-17 NOTE — Progress Notes (Signed)
 Per the initial f/u, The patient attended a screening event on 12/12/2023, where her blood pressure was measured at 110/66 mmHg, and her non-fasting blood glucose was 126 mg/dl, placing her in the diabetic range. During the event, the patient reported that she does/does not smoke, has insurance, isn't established with a PCP and indicated a housing SDOH concerns. No smoking status reported in the last 12 months but has a history reported fro May 24, 2022.   A chart review confirmed that the patient does not have an active PCP. She has Medicare and Medicaid for her health insurance. Chart review further indicates that she has a housing SDOH need.    Call Attempt #1: CHW called both numbers in chart to speak with pt about PCP status. Unable to reach pt at this time. CHW will attempt calls to pt again later.   Call Attempt #2: CHW called both numbers again to speak with pt. CHW was unable to reach pt again.   Call Attempt #3: CHW called for third attempt to pt. Both numbers were not reachable again to speak with pt.   CHW in basket CNP RN Aloysius Ford about the pt's PCP, blood glucose education, and housing SDOH need plus  211 to further support her in case she returns to [facility] for further support.  An additional follow up will be done at a later date per the [employer] protocol.  During the 60 day f/u interval, the pt has not returned to any other screening events and obtained care since the initial f/u interval.   60 Day F/u Call Attempt: CHW called pt at both numbers to obtain any updated since the initial event f/u. Both numbers are not valid for this patient at this time. CHW was unable to leave vm.   CHW sent additional letter at 60 day f/u to pt with PCP resources, housing resources, and smoking cessation in case needed by the pt.   An additional follow up will be done in according to the health equity team's protocol.
